# Patient Record
Sex: Female | Born: 1975 | Race: White | Hispanic: No | Marital: Single | Smoking: Current every day smoker
Health system: Southern US, Community
[De-identification: ages and names within clinical notes are randomized; demographics above are authoritative.]

## PROBLEM LIST (undated history)

## (undated) ENCOUNTER — Inpatient Hospital Stay (HOSPITAL_COMMUNITY): Payer: Self-pay

## (undated) DIAGNOSIS — O4400 Placenta previa specified as without hemorrhage, unspecified trimester: Secondary | ICD-10-CM

## (undated) DIAGNOSIS — R87629 Unspecified abnormal cytological findings in specimens from vagina: Secondary | ICD-10-CM

## (undated) DIAGNOSIS — C539 Malignant neoplasm of cervix uteri, unspecified: Secondary | ICD-10-CM

## (undated) DIAGNOSIS — Z789 Other specified health status: Secondary | ICD-10-CM

## (undated) DIAGNOSIS — Z86718 Personal history of other venous thrombosis and embolism: Secondary | ICD-10-CM

## (undated) HISTORY — PX: TONSILLECTOMY: SUR1361

## (undated) HISTORY — DX: Unspecified abnormal cytological findings in specimens from vagina: R87.629

## (undated) HISTORY — PX: APPENDECTOMY: SHX54

## (undated) HISTORY — PX: CERVICAL CONE BIOPSY: SUR198

## (undated) HISTORY — PX: ABDOMINAL SURGERY: SHX537

## (undated) HISTORY — DX: Malignant neoplasm of cervix uteri, unspecified: C53.9

## (undated) HISTORY — PX: CHOLECYSTECTOMY: SHX55

---

## 2000-02-24 ENCOUNTER — Emergency Department (HOSPITAL_COMMUNITY): Admission: EM | Admit: 2000-02-24 | Discharge: 2000-02-24 | Payer: Self-pay | Admitting: Emergency Medicine

## 2000-02-24 ENCOUNTER — Encounter: Payer: Self-pay | Admitting: Emergency Medicine

## 2000-12-10 ENCOUNTER — Emergency Department (HOSPITAL_COMMUNITY): Admission: EM | Admit: 2000-12-10 | Discharge: 2000-12-10 | Payer: Self-pay | Admitting: *Deleted

## 2000-12-12 ENCOUNTER — Emergency Department (HOSPITAL_COMMUNITY): Admission: EM | Admit: 2000-12-12 | Discharge: 2000-12-12 | Payer: Self-pay | Admitting: Emergency Medicine

## 2000-12-12 ENCOUNTER — Encounter: Payer: Self-pay | Admitting: Emergency Medicine

## 2000-12-12 ENCOUNTER — Encounter: Payer: Self-pay | Admitting: General Surgery

## 2000-12-12 ENCOUNTER — Inpatient Hospital Stay (HOSPITAL_COMMUNITY): Admission: AD | Admit: 2000-12-12 | Discharge: 2000-12-19 | Payer: Self-pay | Admitting: General Surgery

## 2000-12-13 ENCOUNTER — Encounter: Payer: Self-pay | Admitting: General Surgery

## 2000-12-14 ENCOUNTER — Encounter: Payer: Self-pay | Admitting: General Surgery

## 2001-01-05 ENCOUNTER — Emergency Department (HOSPITAL_COMMUNITY): Admission: EM | Admit: 2001-01-05 | Discharge: 2001-01-05 | Payer: Self-pay | Admitting: *Deleted

## 2001-01-05 ENCOUNTER — Encounter: Payer: Self-pay | Admitting: *Deleted

## 2002-04-06 ENCOUNTER — Ambulatory Visit (HOSPITAL_COMMUNITY): Admission: RE | Admit: 2002-04-06 | Discharge: 2002-04-06 | Payer: Self-pay | Admitting: *Deleted

## 2002-04-06 ENCOUNTER — Encounter: Payer: Self-pay | Admitting: *Deleted

## 2002-04-24 ENCOUNTER — Inpatient Hospital Stay (HOSPITAL_COMMUNITY): Admission: AD | Admit: 2002-04-24 | Discharge: 2002-04-27 | Payer: Self-pay | Admitting: *Deleted

## 2002-04-28 ENCOUNTER — Emergency Department (HOSPITAL_COMMUNITY): Admission: EM | Admit: 2002-04-28 | Discharge: 2002-04-28 | Payer: Self-pay | Admitting: Emergency Medicine

## 2002-08-18 ENCOUNTER — Emergency Department (HOSPITAL_COMMUNITY): Admission: EM | Admit: 2002-08-18 | Discharge: 2002-08-18 | Payer: Self-pay | Admitting: *Deleted

## 2002-08-18 ENCOUNTER — Encounter: Payer: Self-pay | Admitting: *Deleted

## 2003-03-29 ENCOUNTER — Ambulatory Visit (HOSPITAL_COMMUNITY): Admission: EM | Admit: 2003-03-29 | Discharge: 2003-03-29 | Payer: Self-pay | Admitting: Emergency Medicine

## 2003-03-29 ENCOUNTER — Encounter: Payer: Self-pay | Admitting: *Deleted

## 2003-04-10 ENCOUNTER — Ambulatory Visit (HOSPITAL_COMMUNITY): Admission: AD | Admit: 2003-04-10 | Discharge: 2003-04-10 | Payer: Self-pay | Admitting: Obstetrics and Gynecology

## 2003-05-17 ENCOUNTER — Inpatient Hospital Stay (HOSPITAL_COMMUNITY): Admission: EM | Admit: 2003-05-17 | Discharge: 2003-05-20 | Payer: Self-pay | Admitting: *Deleted

## 2003-06-04 ENCOUNTER — Ambulatory Visit (HOSPITAL_COMMUNITY): Admission: RE | Admit: 2003-06-04 | Discharge: 2003-06-04 | Payer: Self-pay | Admitting: *Deleted

## 2003-06-24 ENCOUNTER — Ambulatory Visit (HOSPITAL_COMMUNITY): Admission: AD | Admit: 2003-06-24 | Discharge: 2003-06-24 | Payer: Self-pay | Admitting: *Deleted

## 2003-07-09 ENCOUNTER — Inpatient Hospital Stay (HOSPITAL_COMMUNITY): Admission: AD | Admit: 2003-07-09 | Discharge: 2003-07-11 | Payer: Self-pay | Admitting: *Deleted

## 2003-11-02 ENCOUNTER — Emergency Department (HOSPITAL_COMMUNITY): Admission: EM | Admit: 2003-11-02 | Discharge: 2003-11-03 | Payer: Self-pay | Admitting: Emergency Medicine

## 2003-11-05 ENCOUNTER — Emergency Department (HOSPITAL_COMMUNITY): Admission: EM | Admit: 2003-11-05 | Discharge: 2003-11-05 | Payer: Self-pay | Admitting: Emergency Medicine

## 2003-12-12 ENCOUNTER — Emergency Department (HOSPITAL_COMMUNITY): Admission: EM | Admit: 2003-12-12 | Discharge: 2003-12-12 | Payer: Self-pay | Admitting: Emergency Medicine

## 2004-03-15 ENCOUNTER — Emergency Department (HOSPITAL_COMMUNITY): Admission: EM | Admit: 2004-03-15 | Discharge: 2004-03-15 | Payer: Self-pay | Admitting: Emergency Medicine

## 2004-03-17 ENCOUNTER — Emergency Department (HOSPITAL_COMMUNITY): Admission: EM | Admit: 2004-03-17 | Discharge: 2004-03-17 | Payer: Self-pay | Admitting: *Deleted

## 2004-04-15 ENCOUNTER — Emergency Department (HOSPITAL_COMMUNITY): Admission: EM | Admit: 2004-04-15 | Discharge: 2004-04-15 | Payer: Self-pay | Admitting: Emergency Medicine

## 2004-04-19 ENCOUNTER — Emergency Department (HOSPITAL_COMMUNITY): Admission: EM | Admit: 2004-04-19 | Discharge: 2004-04-19 | Payer: Self-pay | Admitting: Emergency Medicine

## 2004-08-06 ENCOUNTER — Other Ambulatory Visit: Admission: RE | Admit: 2004-08-06 | Discharge: 2004-08-06 | Payer: Self-pay | Admitting: *Deleted

## 2004-10-09 ENCOUNTER — Emergency Department (HOSPITAL_COMMUNITY): Admission: EM | Admit: 2004-10-09 | Discharge: 2004-10-09 | Payer: Self-pay | Admitting: Emergency Medicine

## 2005-10-18 ENCOUNTER — Emergency Department (HOSPITAL_COMMUNITY): Admission: EM | Admit: 2005-10-18 | Discharge: 2005-10-18 | Payer: Self-pay | Admitting: Emergency Medicine

## 2005-12-03 ENCOUNTER — Emergency Department (HOSPITAL_COMMUNITY): Admission: EM | Admit: 2005-12-03 | Discharge: 2005-12-03 | Payer: Self-pay | Admitting: Emergency Medicine

## 2007-01-28 ENCOUNTER — Emergency Department (HOSPITAL_COMMUNITY): Admission: EM | Admit: 2007-01-28 | Discharge: 2007-01-28 | Payer: Self-pay | Admitting: Emergency Medicine

## 2007-04-26 ENCOUNTER — Emergency Department (HOSPITAL_COMMUNITY): Admission: EM | Admit: 2007-04-26 | Discharge: 2007-04-26 | Payer: Self-pay | Admitting: *Deleted

## 2008-02-26 ENCOUNTER — Emergency Department (HOSPITAL_COMMUNITY): Admission: EM | Admit: 2008-02-26 | Discharge: 2008-02-26 | Payer: Self-pay | Admitting: Emergency Medicine

## 2008-02-27 ENCOUNTER — Ambulatory Visit (HOSPITAL_COMMUNITY): Admission: RE | Admit: 2008-02-27 | Discharge: 2008-02-27 | Payer: Self-pay | Admitting: Emergency Medicine

## 2008-04-07 ENCOUNTER — Encounter: Payer: Self-pay | Admitting: Emergency Medicine

## 2008-04-08 ENCOUNTER — Inpatient Hospital Stay (HOSPITAL_COMMUNITY): Admission: AD | Admit: 2008-04-08 | Discharge: 2008-04-08 | Payer: Self-pay | Admitting: Obstetrics & Gynecology

## 2009-03-29 ENCOUNTER — Observation Stay (HOSPITAL_COMMUNITY): Admission: AD | Admit: 2009-03-29 | Discharge: 2009-03-30 | Payer: Self-pay | Admitting: Family Medicine

## 2009-03-29 ENCOUNTER — Encounter: Payer: Self-pay | Admitting: Emergency Medicine

## 2009-03-29 ENCOUNTER — Ambulatory Visit: Payer: Self-pay | Admitting: Family Medicine

## 2009-08-14 ENCOUNTER — Emergency Department (HOSPITAL_COMMUNITY): Admission: EM | Admit: 2009-08-14 | Discharge: 2009-08-14 | Payer: Self-pay | Admitting: Emergency Medicine

## 2010-09-09 LAB — URINALYSIS, ROUTINE W REFLEX MICROSCOPIC
Bilirubin Urine: NEGATIVE
Glucose, UA: NEGATIVE mg/dL
Ketones, ur: NEGATIVE mg/dL
Nitrite: POSITIVE — AB
Protein, ur: 100 mg/dL — AB
Specific Gravity, Urine: 1.025 (ref 1.005–1.030)
Urobilinogen, UA: 0.2 mg/dL (ref 0.0–1.0)
pH: 6 (ref 5.0–8.0)

## 2010-09-09 LAB — COMPREHENSIVE METABOLIC PANEL
ALT: <7 U/L (ref 0–35)
AST: 12 U/L (ref 0–37)
Albumin: 3.6 g/dL (ref 3.5–5.2)
Alkaline Phosphatase: 49 U/L (ref 39–117)
BUN: 8 mg/dL (ref 6–23)
CO2: 24 mEq/L (ref 19–32)
Calcium: 8.6 mg/dL (ref 8.4–10.5)
Chloride: 106 mEq/L (ref 96–112)
Creatinine, Ser: 0.95 mg/dL (ref 0.4–1.2)
GFR calc Af Amer: >60 mL/min (ref >60)
GFR calc non Af Amer: >60 mL/min (ref >60)
Glucose, Bld: 127 mg/dL — ABNORMAL HIGH (ref 70–99)
Potassium: 3.7 mEq/L (ref 3.5–5.1)
Sodium: 139 mEq/L (ref 135–145)
Total Bilirubin: 0.6 mg/dL (ref 0.3–1.2)
Total Protein: 6.3 g/dL (ref 6.0–8.3)

## 2010-09-09 LAB — URINE CULTURE: Colony Count: >=100000

## 2010-09-09 LAB — RAPID URINE DRUG SCREEN, HOSP PERFORMED
Amphetamines: NOT DETECTED
Barbiturates: NOT DETECTED
Benzodiazepines: NOT DETECTED
Cocaine: POSITIVE — AB
Opiates: NOT DETECTED
Tetrahydrocannabinol: POSITIVE — AB

## 2010-09-09 LAB — CBC
HCT: 39.8 % (ref 36.0–46.0)
Hemoglobin: 13.6 g/dL (ref 12.0–15.0)
MCHC: 34.1 g/dL (ref 30.0–36.0)
MCV: 88.1 fL (ref 78.0–100.0)
Platelets: 132 10*3/uL — ABNORMAL LOW (ref 150–400)
RBC: 4.52 MIL/uL (ref 3.87–5.11)
RDW: 13.1 % (ref 11.5–15.5)
WBC: 9.1 10*3/uL (ref 4.0–10.5)

## 2010-09-09 LAB — LIPASE, BLOOD: Lipase: 25 U/L (ref 11–59)

## 2010-09-09 LAB — DIFFERENTIAL
Basophils Absolute: 0 10*3/uL (ref 0.0–0.1)
Basophils Relative: 0 % (ref 0–1)
Eosinophils Absolute: 0.1 10*3/uL (ref 0.0–0.7)
Eosinophils Relative: 1 % (ref 0–5)
Lymphocytes Relative: 10 % — ABNORMAL LOW (ref 12–46)
Lymphs Abs: 0.9 10*3/uL (ref 0.7–4.0)
Monocytes Absolute: 0.4 10*3/uL (ref 0.1–1.0)
Monocytes Relative: 4 % (ref 3–12)
Neutro Abs: 7.7 10*3/uL (ref 1.7–7.7)
Neutrophils Relative %: 84 % — ABNORMAL HIGH (ref 43–77)

## 2010-09-09 LAB — URINE MICROSCOPIC-ADD ON

## 2010-09-09 LAB — PREGNANCY, URINE: Preg Test, Ur: NEGATIVE

## 2010-09-24 LAB — CBC
HCT: 34.7 % — ABNORMAL LOW (ref 36.0–46.0)
HCT: 32.6 % — ABNORMAL LOW (ref 36.0–46.0)
Hemoglobin: 12.4 g/dL (ref 12.0–15.0)
Hemoglobin: 11.5 g/dL — ABNORMAL LOW (ref 12.0–15.0)
MCHC: 35.8 g/dL (ref 30.0–36.0)
MCHC: 35.1 g/dL (ref 30.0–36.0)
MCHC: 35.3 g/dL (ref 30.0–36.0)
MCV: 87.1 fL (ref 78.0–100.0)
MCV: 88.6 fL (ref 78.0–100.0)
MCV: 88.7 fL (ref 78.0–100.0)
Platelets: 156 10*3/uL (ref 150–400)
Platelets: 141 10*3/uL — ABNORMAL LOW (ref 150–400)
RBC: 3.98 MIL/uL (ref 3.87–5.11)
RBC: 3.68 MIL/uL — ABNORMAL LOW (ref 3.87–5.11)
RBC: 3.69 MIL/uL — ABNORMAL LOW (ref 3.87–5.11)
RDW: 12.8 % (ref 11.5–15.5)
RDW: 13.1 % (ref 11.5–15.5)
WBC: 6.6 10*3/uL (ref 4.0–10.5)

## 2010-09-24 LAB — DIFFERENTIAL
Basophils Absolute: 0 10*3/uL (ref 0.0–0.1)
Basophils Relative: 0 % (ref 0–1)
Basophils Relative: 1 % (ref 0–1)
Eosinophils Absolute: 0.1 10*3/uL (ref 0.0–0.7)
Eosinophils Relative: 3 % (ref 0–5)
Eosinophils Relative: 3 % (ref 0–5)
Monocytes Absolute: 0.3 10*3/uL (ref 0.1–1.0)
Monocytes Relative: 5 % (ref 3–12)
Neutro Abs: 4.9 10*3/uL (ref 1.7–7.7)
Neutrophils Relative %: 71 % (ref 43–77)

## 2010-09-24 LAB — BASIC METABOLIC PANEL
BUN: 9 mg/dL (ref 6–23)
CO2: 26 mEq/L (ref 19–32)
Calcium: 8.7 mg/dL (ref 8.4–10.5)
Chloride: 107 mEq/L (ref 96–112)
Creatinine, Ser: 0.48 mg/dL (ref 0.4–1.2)
GFR calc Af Amer: >60 mL/min (ref >60)
GFR calc non Af Amer: >60 mL/min (ref >60)
Glucose, Bld: 77 mg/dL (ref 70–99)
Potassium: 3.5 mEq/L (ref 3.5–5.1)
Sodium: 137 mEq/L (ref 135–145)

## 2010-09-24 LAB — URINALYSIS, ROUTINE W REFLEX MICROSCOPIC
Bilirubin Urine: NEGATIVE
Glucose, UA: NEGATIVE mg/dL
Hgb urine dipstick: NEGATIVE
Ketones, ur: NEGATIVE mg/dL
Nitrite: NEGATIVE
Protein, ur: NEGATIVE mg/dL
Specific Gravity, Urine: 1.025 (ref 1.005–1.030)
Urobilinogen, UA: 0.2 mg/dL (ref 0.0–1.0)
pH: 6.5 (ref 5.0–8.0)

## 2010-09-24 LAB — RAPID URINE DRUG SCREEN, HOSP PERFORMED
Amphetamines: NOT DETECTED
Barbiturates: NOT DETECTED
Benzodiazepines: NOT DETECTED
Cocaine: POSITIVE — AB
Opiates: POSITIVE — AB
Tetrahydrocannabinol: NOT DETECTED

## 2010-09-24 LAB — RPR: RPR Ser Ql: NONREACTIVE

## 2010-09-24 LAB — HCG, QUANTITATIVE, PREGNANCY
hCG, Beta Chain, Quant, S: 0 m[IU]/mL (ref <5)
hCG, Beta Chain, Quant, S: 63926 m[IU]/mL — ABNORMAL HIGH (ref <5)

## 2010-09-24 LAB — ETHANOL: Alcohol, Ethyl (B): <5 mg/dL (ref 0–10)

## 2010-09-24 LAB — WET PREP, GENITAL: Trich, Wet Prep: NONE SEEN

## 2010-09-24 LAB — PREGNANCY, URINE: Preg Test, Ur: POSITIVE

## 2010-11-06 NOTE — Discharge Summary (Signed)
   NAME:  Hannah Gilmore, Hannah Gilmore                       ACCOUNT NO.:  [id number]   MEDICAL RECORD NO.:  [id number]                   PATIENT TYPE:  INP   LOCATION:  A417                                 FACILITY:  [facility]   PHYSICIAN:  Langley Gauss, M.D.                DATE OF BIRTH:  Jan 16, 1976   DATE OF ADMISSION:  04/24/2002  DATE OF DISCHARGE:  04/27/2002                                 DISCHARGE SUMMARY   DIAGNOSES:  1. A 35-6/7 weeks intrauterine pregnancy.  2. Preterm labor.  3. Substance abuse preceding and during the pregnancy.  4. Insufficient prenatal care.   PROCEDURE:  Delivery performed is precipitous spontaneous assisted vaginal  delivery, a 5-pound 5-ounce female infant delivered over a midline episiotomy  with repair.   LABORATORY DATA:  Pertinent laboratory studies:  Admission hemoglobin and  hematocrit 13.0/36.7 with a white count of 10.9.  Postpartum day #1,  12.4/35.8 with a white count of 7.7.  Pertinently, urine drug screen  obtained on hospitalization day #1 was positive for both for cocaine and for  opiates.  Testing on the newborn infant is currently pending.   DISPOSITION:  The patient will follow up in the office in four weeks' time  for postpartum care.  She is bottle feeding at time of discharge.  She is  complaining of some breast engorgement.  She is advised to use cabbage  leaves for this only.  The patient does desire circumcision.  They did not  meet their financial obligation during this hospitalization; however, they  would like to possibly have this done as an outpatient in the office in one  week's time.   HOSPITAL COURSE:  See previous dictations.  The patient presented on  April 24, 2002 at preterm labor at 10 cm.  She progressed thereafter very  rapidly to a well-controlled atraumatic spontaneous assisted vaginal  delivery.  Initially, the infant did have some respiratory compromise, was  evaluated by Dr. Vivia Ewing.  Thereafter, the infant was  on an Oxyhood.  Apparently, he did well during the remainder of hospitalization.  Likewise,  the patient did well during the remainder of the hospitalization with no  excessive vaginal bleeding.  She did resume consumption of cigarettes and  made frequent trips to outside.  The patient and infant are discharged today  on day #2-1/2.  Pertinently, as indicated previously, circumcision not  performed during this hospitalization.                                               Langley Gauss, M.D.    DC/MEDQ  D:  04/27/2002  T:  04/28/2002  Job:  [id number]

## 2010-11-06 NOTE — Op Note (Signed)
NAME:  Hannah Gilmore, Hannah Gilmore                       ACCOUNT NO.:  [id number]   MEDICAL RECORD NO.:  [id number]                  PATIENT TYPE:  PINP   LOCATION:  404                                  FACILITY:  [facility]   PHYSICIAN:  Langley Gauss, M.D.                DATE OF BIRTH:  10/30/1951   DATE OF PROCEDURE:  07/09/2003  DATE OF DISCHARGE:                                 OPERATIVE REPORT   PROCEDURE:  Placement of continuous lumbar epidural analgesia at the L3-4  interspace performed by Langley Gauss, M.D.   SUMMARY:  Appropriate informed consent was obtained.  The patient placed in  the seated position, bony landmarks identified.  The patient's back is  sterilely prepped and draped in the usual sterile manner. The epidural tray  is utilized, 5 mL of 1% lidocaine placed at the midline of the L3-4  interspace to raise a small skin wheal. The 17 gauge Tuohy-Schliff needle is  then utilized to identify the epidural space.  On the first attempt, the  bony spinous process was encountered thus the epidural needle is directed  slightly caudad and on the second attempt there is excellent loss of  resistance consistent with entry into the epidural space. 5 mL of 1.5%  lidocaine plus epinephrine injected through the epidural catheter and all  signs of CSF or intravascular injection obtained. The epidural catheter  tubing is then inserted to a depth of 5 cm.  The epidural needle is removed,  aspiration test is negative.  A second test dose 2 mL, 1.5% lidocaine plus  epinephrine injected through the epidural catheter.  Again no signs of CSF  or intravascular injection obtained thus the catheter is secured into place.  The patient is returned to the left lateral decubitus position and connected  to the infusion pump with the standard mixture. She will be treated with a  bolus of 10 mL followed by a continuous infusion rate of 14 mL per hour.  At  the completion of the procedure, the patient is  examined, she is noted to be  5 cm dilated, 60% effaced with a vertex of 0 station.  Fetal scalp electrode  was placed with the resulting amniotomy and a reassuring fetal heart rate is  noted.      ___________________________________________                                            Langley Gauss, M.D.   DC/MEDQ  D:  07/09/2003  T:  07/09/2003  Job:  [id number]
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
## 2010-11-06 NOTE — Op Note (Signed)
NAME:  Hannah Gilmore, Hannah Gilmore                       ACCOUNT NO.:  [id number]   MEDICAL RECORD NO.:  [id number]                   PATIENT TYPE:  INP   LOCATION:  A417                                 FACILITY:  [facility]   PHYSICIAN:  Langley Gauss, M.D.                DATE OF BIRTH:  09-Sep-1975   DATE OF PROCEDURE:  04/25/2002  DATE OF DISCHARGE:                                 OPERATIVE REPORT   DIAGNOSIS:  35 6/7 week intrauterine pregnancy presenting in preterm labor.   DELIVERY PERFORMED:  Precipitous well controlled spontaneous cystovaginal  delivery of 5 pound 5 ounce female infant delivered over a midline episiotomy  with repair.   ANALGESIA FOR DELIVERY:  The patient received only lidocaine local at time  of delivery.   Apgar score 7&9, A positive blood type for the patient.   HOSPITAL COURSE:  The patient presented p.m. of April 24, 2002 at complete  dilatation; thereafter, she progressed very rapidly to a well controlled  vaginal delivery which was performed on April 24, 2002. The patient  presented at complete dilatation. Fetal scalp electrode was placed with a  resulting amniotomy, clear amniotic fluid was noted, reassuring fetal heart  rate was noted. The patient thereafter had a strong urge to push. She was  placed in the dorsal lithotomy position, prepped and draped in the usual  sterile fashion. She pushed well during a short second stage of labor. A  small midline episiotomy was performed. Infant delivered in a direct OA  position over the midline episiotomy without extension. Mouth and nares were  both suctioned of clear amniotic fluid. Renewed expulsive efforts result in  spontaneous rotation to a left anterior shoulder position. Gentle abdominal  retraction ______________ expulsive efforts resulted in delivery of the  shoulder through the symphysis without difficulty, the remainder of the  infant likewise delivered without difficulty. The umbilical cord was then  milked towards the infant, cord was doubly clamped and cut and infant was  handed to the nursing staff for immediate assessment. Arterial cord gas and  cord blood are then obtained. Gentle traction on the umbilical cord resulted  in separation which upon examination appears to be an intact three vessel  placenta with attached cord. Excellent uterine tone is achieved immediately  following delivery utilizing IV Pitocin solution. Episiotomy is easily  repaired utilizing a single layer closure of #0 Chromic on the perineal  body. The patient tolerated the delivery very well. The infant, due to  prematurity, is having mild respiratory distress thus Dr. Vivia Ewing is  consulted for immediate newborn care.                                               Langley Gauss, M.D.    DC/MEDQ  D:  04/26/2002  T:  04/27/2002  Job:  [id number]

## 2010-11-06 NOTE — H&P (Signed)
   NAME:  Hannah Gilmore, Hannah Gilmore                       ACCOUNT NO.:  [id number]   MEDICAL RECORD NO.:  [id number]                   PATIENT TYPE:  INP   LOCATION:  A417                                 FACILITY:  [facility]   PHYSICIAN:  Langley Gauss, M.D.                DATE OF BIRTH:  02/11/1976   DATE OF ADMISSION:  04/24/2002  DATE OF DISCHARGE:                                HISTORY & PHYSICAL   HISTORY OF PRESENT ILLNESS:  This is a 35 year old gravida III, para II at  35-6/[redacted] weeks gestation, presents to [facility] in active labor,  complaining of onset of uterine contractions during that day. Pertinently,  the patient had had a scheduled prenatal visit during today's date which she  failed to keep. The patient's prenatal course is complicated by findings of  positive cocaine screen. She has only had two prenatal visits. The patient  does admit to cocaine use this a.m. The patient was advised of this by  social services and at one point and time was off it but decline to proceed  with drug rehab program.   PAST OBSTETRICAL HISTORY:  The patient's past OB history:  10/93, vaginal  delivery, 6 pound infant at [redacted] weeks gestation; 3/00 vaginal delivery, 4  pounds at [redacted] weeks gestation which was in the intensive care unit for seven  days duration.   ALLERGIES:  No known drug allergies.   CURRENT MEDICATIONS:  Prenatal vitamins only.   PHYSICAL EXAMINATION:  VITAL SIGNS:  Height 5 feet 4, 110 pounds. 140/79,  pulse 117, respiratory rate 24.  HEENT:  Negative.  NECK:  No adenopathy. Neck is supple. Thyroid is nonpalpable.  LUNGS:  Clear.  CARDIOVASCULAR:  Regular, rate, and rhythm.  ABDOMEN:  Soft and nontender. Gravid uterus identified. Fundal head at 36  cm. She has a vertex presentation by [employer]. There is no uterine  tenderness. Intact membranes identified.  PELVIC EXAM:  Completely dilated, +1 station, completely effaced with intact  bulging membranes. External  fetal monitor contractions every three minutes  with reassuring fetal heart rate.   ASSESSMENT:  A 35-6/7 weeks intrauterine pregnancy, presenting in active  preterm labor with delivery noted to be eminent. Preparations made hurriedly  for expected vaginal delivery.                                               Langley Gauss, M.D.    DC/MEDQ  D:  04/26/2002  T:  04/27/2002  Job:  [id number]
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
## 2010-11-06 NOTE — Discharge Summary (Signed)
NAME:  Hannah Gilmore, Hannah Gilmore                       ACCOUNT NO.:  [id number]   MEDICAL RECORD NO.:  [id number]                   PATIENT TYPE:  INP   LOCATION:  LDR4                                 FACILITY:  [facility]   PHYSICIAN:  Langley Gauss, M.D.                DATE OF BIRTH:  [birth year]   DATE OF ADMISSION:  07/09/2003  DATE OF DISCHARGE:  07/11/2003                                 DISCHARGE SUMMARY   No dictation for this job.     ___________________________________________                                         Langley Gauss, M.D.   DC/MEDQ  D:  07/13/2003  T:  07/13/2003  Job:  [id number]

## 2010-11-06 NOTE — Discharge Summary (Signed)
NAME:  Hannah Gilmore, Hannah Gilmore                       ACCOUNT NO.:  [id number]   MEDICAL RECORD NO.:  [id number]                   PATIENT TYPE:  INP   LOCATION:  LDR4                                 FACILITY:  [facility]   PHYSICIAN:  Langley Gauss, M.D.                DATE OF BIRTH:  1975-10-14   DATE OF ADMISSION:  07/09/2003  DATE OF DISCHARGE:  07/11/2003                                 DISCHARGE SUMMARY   Delivery was performed on July 09, 2003 without difficulty, utilizing an  epidural analgesic which was placed during the course of labor by Dr. Langley Gauss.   DISPOSITION:  At time of discharge, the patient is advised to follow up in  the office in four weeks' time. She does desire permanent sterilization  which can be scheduled as an outpatient procedure at that point in time.   PERTINENT LABORATORY DATA:  Hemoglobin and hematocrit 12.8/37.5 with a white  count of 13.3, on postpartum day #1, 12.4/35.2 with a white count of 15.2.  The patient was noted to have tested positive for THC and positive for  opiates on drug screen obtained upon presentation to labor and delivery.  She, however, was noted to be negative for cocaine and its metabolites on  that drug screen. A positive blood type.   DISCHARGE MEDICATIONS:  1. Tylox #30 only for postpartum pain relief.  2. Ambien #30 to take one 10-mg tablet p.o. q.h.s. p.r.n.   HOSPITAL COURSE:  The patient delivered on July 09, 2003 in an  uncomplicated manner utilizing an epidural analgesic. She was noted to be  preterm at 35-1/[redacted] weeks gestation upon presentation and labor. Though  patient had been noted to test negative for group B strep carrier status,  this was performed early; thus, according to the high-risk protocol, the  patient was treated during the course of labor with IV ampicillin.  Postpartum, both mother and infant did well. There were no postpartum  complications. Mother bonded well with the infant and had good  family  support such that patient was prepared for discharge and was given a copy of  the standardized discharge instructions to be discharged on July 11, 2003.     ___________________________________________                                         Langley Gauss, M.D.   DC/MEDQ  D:  07/15/2003  T:  07/15/2003  Job:  [id number]
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
## 2010-11-06 NOTE — H&P (Signed)
NAME:  Hannah Gilmore, COFFIN NO.:  [id number]   MEDICAL RECORD NO.:  [id number]                   PATIENT TYPE:   LOCATION:                                       FACILITY:   PHYSICIAN:  Langley Gauss, MD                  DATE OF BIRTH:  October 09, 1975   DATE OF ADMISSION:  DATE OF DISCHARGE:                                HISTORY & PHYSICAL   HISTORY OF PRESENT ILLNESS:  The patient is a 35 year old gravida 3, para 2  with an unknown date of admission who was seen in the office as a new OB  patient today.  Currently, the patient has had inadequate prenatal care.  She had had one previous visit to [facility], at which time she was  treated for preterm labor with terbutaline 5 mg p.o. q.4h.  The patient did  not have an ultrasound done.  Her best guesstimate is that her gestational  age right now is [redacted] weeks gestation; however, her last menstrual period is  unreliable, as she states it occurred sometime in February and, in addition  to that, the last one was abnormal and that it was only several days of  spotting.  Thus, no reliable dates are available at present.  The patient's  prenatal course complicated by this single visit to the hospital only.  The  patient has continued on modified bedrest and done well with no complaints  of uterine contractions.  No complaints of pelvic pressure.  She does state  that she seems to have onset of uterine contractions about the time it is  ready for p.o. terbutaline.  She did take her last p.o. terbutaline last  p.m.  The patient came to our office today, April 03, 2002, as a new  patient.   PAST OBSTETRICAL HISTORY:  October 1993 vaginal delivery, 6-pound infant, [redacted]  weeks gestation.  August 28, 1998 vaginal delivery at [facility] of a 4-  pound infant at [redacted] weeks gestation.  The patient states she was cared for by  Dr. Gilford Silvius in [place] during that pregnancy.  She presented complaining of  pelvic pressure,  was seen in our office, at which time she was noted to be 2-  cm dilated, referred to [facility], at which time she was noted to have  regular uterine contractions.  Uterine contractions continued until she was  4-cm dilatation.  She was thereafter transferred to [facility] where  the labor continued through delivery 13 hours later.  The infant was in the  ICU at [facility] x7 days' duration.   ALLERGIES:  No known drug allergies.   CURRENT MEDICATIONS:  1. Prenatal vitamins.  2. Last p.o. terbutaline last p.m.   PAST MEDICAL HISTORY:  Tonsils in 1995.  The patient was treated with laser  of the cervix in 1997 for what she describes as cervical cancer.  In 2002,  had appendix and gallbladder removed.  Two prior vaginal deliveries.   SOCIAL HISTORY:  The patient is, of course, unemployed.  She is separated  and does smoke, of course, one pack per day.   PHYSICAL EXAMINATION:  GENERAL:  Appears greater than stated age.  VITAL SIGNS:  Blood pressure 114/73, pulse rate of 80, respiratory rate is  20.  HEENT:  Negative.  No adenopathy.  NECK:  Supple.  Thyroid is nonpalpable.  Mild acne.  LUNGS:  Clear.  CARDIOVASCULAR:  Regular rate and rhythm.  ABDOMEN:  Soft and nontender.  Laparoscopic surgical incisions only.  Vertex  presentation by Leopold's maneuvers.  Fundal height of 31 cm.  EXTREMITIES:  Normal.  PELVIC:  Normal external genitalia.  No lesions or ulcerations identified.  No leakage of fluid or vaginal bleeding.  No abnormal discharge identified.  Cervix noted to be 1 cm dilated, -2 station, 60% effaced, with a vertex  presentation palpable.   LABORATORY DATA:  A limited sonogram was performed by Dr. Roylene Reason. Lisette Grinder  in the office which reveals a single intrauterine pregnancy, vertex  presentation, female fetus, normal amniotic fluid subjectively.  Good fetal  movement was identified.  Normal fetal tone.  Fetal cardiac activity is  likewise identified with a  four-chamber view seen.  Noted the posterior  fundal placenta.  Parameters are obtained for femur length, BPD, and  abdominal circumference which are all consistent with [redacted] weeks gestation.   ASSESSMENT:  Inadequate prenatal care.  Late transfer to our office.  Best  gestational age at the present would be about [redacted] weeks gestation.  The  patient with some premature dilatation and history of prior preterm  delivery.  Thus, at this point in time, laboratory studies will be repeated,  prenatal profile.  She is referred to [facility] for complete ultrasound to  include anatomic survey and the patient has restricted activities to  bedrest.  She will follow up in the office in one week's time, at which time  we can review her findings to date.  She is continued on the terbutaline 2.5  mg p.o. q.4h. while awake #40 with no refill.  She is planning on utilizing  pediatrician on-call.  Signs and symptoms of preterm labor and spontaneous  rupture of membranes are reviewed today.  The patient is well advised and  aware to present to [facility] should onset of uterine contractions recur.                                               Langley Gauss, MD    DC/MEDQ  D:  04/03/2002  T:  04/03/2002  Job:  [id number]

## 2010-11-06 NOTE — Discharge Summary (Signed)
NAME:  Hannah Gilmore, Hannah Gilmore                       ACCOUNT NO.:  [id number]   MEDICAL RECORD NO.:  [id number]                   PATIENT TYPE:  OBV   LOCATION:  A415                                 FACILITY:  [facility]   PHYSICIAN:  Langley Gauss, M.D.                DATE OF BIRTH:  1975/08/06   DATE OF ADMISSION:  04/10/2003  DATE OF DISCHARGE:  04/10/2003                                 DISCHARGE SUMMARY   OB OBSERVATION NOTE.   DATE OF VISIT:  April 10, 2003   This is a 35 year old gravida 4, para 3, currently at [redacted] weeks gestation who  presents to labor and delivery with the chief complaint of leaking fluid.  The patient states that most of the day she had a sticky mucus-like  discharge and then about 1800 this p.m. she leaked some clear fluid which  she stated had the consistency of urine.  She has had no recurrence of that.  She denies any vaginal bleeding, she denies any uterine cramping. The  patient's prenatal course is complicated by late presentation.  Her first  prenatal visit was to labor and delivery as an OB unassigned. I took care of  her during that visit. She was noted to test positive, at that time, for  cocaine and THC and has been counseled regarding that.   PAST MEDICAL HISTORY:  She has had 3 prior vaginal deliveries, the most  recent delivering at 37 weeks.  The patient did well during her pregnancy  with cessation of cocaine use, however, in between pregnancies the addictive  nature of the drug resulted in a relapse.   PHYSICAL EXAMINATION:  GENERAL:  No distress.  VITAL SIGNS:  115/62, respiratory rate is 20, pulse 100.  HEENT:  Negative.  NECK:  No adenopathy.  Neck is supple.  Thyroid is nonpalpable.  LUNGS:  Clear.  CARDIOVASCULAR:  Regular rate and rhythm.  ABDOMEN:  The abdomen is soft and nontender.  No surgical scars are  identified.  Vertex presentation Leopold's maneuvers.   On external fetal monitor no uterine contractions are identified.   Fetal  heart rate is normal for 22 weeks with a baseline of 130-140, but of course  decreased long term variability.  PELVIC:  Examination per Dr. Lisette Grinder reveals external genitalia to be within  normal limits.  Mucus-like discharge is noted to be present just at the  introitus.  Sterile speculum examination reveals the cervix to be without  lesions; however, there is significant amount of purulent looking discharge  in the posterior vaginal fornix.  This is noted to be nitrazine negative.   Pertinent also, in the patient's history, is recent finding of positive GC  and Chlamydia cultures.  Due to the patient's nonapplication for Medicaid  and her inability to afford her own health care the patient was required to  [facility] at which time she was treated with Zithromax  1  gm p.o. x1 as well as Rocephin 250 mg IM.  She states her partner has also  been treated.   IMPRESSION:  Cervicitis.  GC and Chlamydia cultures, positive; just recently  treated, resulting in significant purulent discharge.   DISPOSITION:  The patient does have an appointment in the office in 4 days  time which she is encouraged to keep.  No evidence of ruptured membranes on  today's visit.  The signs and symptoms of spontaneous rupture of membranes  and preterm labor are reviewed with the patient.   PERTINENT LABORATORY STUDIES:  Urine drug screen is negative with the  exception of THC.     ___________________________________________                                         Langley Gauss, M.D.   DC/MEDQ  D:  04/10/2003  T:  04/10/2003  Job:  [id number]

## 2010-11-06 NOTE — Discharge Summary (Signed)
   NAME:  Hannah Gilmore, Hannah Gilmore                       ACCOUNT NO.:  [id number]   MEDICAL RECORD NO.:  [id number]                   PATIENT TYPE:  OIB   LOCATION:  A415                                 FACILITY:  [facility]   PHYSICIAN:  Langley Gauss, M.D.                DATE OF BIRTH:  April 25, 1976   DATE OF ADMISSION:  03/29/2003  DATE OF DISCHARGE:  03/29/2003                                 DISCHARGE SUMMARY   OBSTETRICAL OBSERVATION NOTE   HISTORY OF PRESENT ILLNESS:  A 35 year old gravida 4 para 3, three prior  vaginal deliveries, presents to [facility] with complaining of  pelvic pressure x1 week duration and left inguinal pain.  The patient  presented on this date as an OB unassigned.  I had delivered her previous  pregnancy but she had had no prenatal care to date during this pregnancy.  The patient's history is complicated by history of cocaine, marijuana, and  frequent opiate use which she admits to continuing to use during this  pregnancy, currently at [redacted] weeks gestation.   PAST OBSTETRICAL HISTORY:  Three prior vaginal deliveries.   PHYSICAL EXAMINATION:  GENERAL:  No acute distress.  VITAL SIGNS:  Blood pressure 115/65, respirations 20, pulse 85, temperature  97.6.  ABDOMEN:  Soft and nontender.  Fundal height at the umbilicus, soft and  nontender.  Normal uterine tone appreciated.  PELVIC:  Normal external genitalia, no lesions or ulcerations identified.  No vaginal bleeding, no leakage of fluid.  Fetal heart tones were ausculted  in the 150s.   LABORATORY STUDIES:  A positive blood type, antibody screen is negative.  OB  ultrasound reveals intrauterine pregnancy, 20 and four-sevenths weeks  gestation.  Hemoglobin 12.8, hematocrit 37.1, white count of 8.0.  Urinalysis is negative.  The patient did on drug screen test positive for  cocaine and marijuana.  RPR is nonreactive.   HOSPITAL COURSE:  The patient presented and evaluated on March 29, 2003;  was also  discharged to home on March 29, 2003.  The patient did present as  OB unassigned.  She was counseled extensively regarding the adverse effects  of cocaine and marijuana on the pregnancy.  As she had done with previous  pregnancies she had stated that she would be compliant with abstention  during the pregnancy and hopefully this would continue following the  pregnancy.  Following complete evaluation with a prenatal profile as well as  OB ultrasound and determination that the patient was in stable status, the  patient was discharged to home.     ___________________________________________                                         Langley Gauss, M.D.   DC/MEDQ  D:  04/16/2003  T:  04/16/2003  Job:  [id number]

## 2011-03-22 LAB — RAPID URINE DRUG SCREEN, HOSP PERFORMED
Amphetamines: NOT DETECTED
Barbiturates: NOT DETECTED
Benzodiazepines: POSITIVE — AB
Cocaine: POSITIVE — AB

## 2011-03-22 LAB — DIFFERENTIAL
Basophils Absolute: 0.1
Eosinophils Relative: 2
Lymphocytes Relative: 24
Lymphs Abs: 1.4
Monocytes Absolute: 0.3
Neutro Abs: 3.9

## 2011-03-22 LAB — CBC
HCT: 32.5 — ABNORMAL LOW
MCHC: 35.2
MCV: 86.7
Platelets: 211
RDW: 12.9
WBC: 5.8

## 2011-03-22 LAB — URINALYSIS, ROUTINE W REFLEX MICROSCOPIC
Bilirubin Urine: NEGATIVE
Glucose, UA: NEGATIVE
Hgb urine dipstick: NEGATIVE
Ketones, ur: NEGATIVE
Protein, ur: NEGATIVE
Urobilinogen, UA: 0.2

## 2011-03-22 LAB — COMPREHENSIVE METABOLIC PANEL
AST: 12
Albumin: 2.8 — ABNORMAL LOW
BUN: 8
Calcium: 8.6
Chloride: 105
Creatinine, Ser: 0.51
GFR calc Af Amer: >60
Total Bilirubin: 0.1 — ABNORMAL LOW

## 2011-03-23 LAB — WET PREP, GENITAL
Clue Cells Wet Prep HPF POC: NONE SEEN
Trich, Wet Prep: NONE SEEN
Yeast Wet Prep HPF POC: NONE SEEN

## 2011-03-23 LAB — GC/CHLAMYDIA PROBE AMP, GENITAL
Chlamydia, DNA Probe: NEGATIVE
GC Probe Amp, Genital: NEGATIVE

## 2011-03-24 LAB — DIFFERENTIAL
Basophils Absolute: 0
Lymphocytes Relative: 15
Lymphs Abs: 1
Monocytes Absolute: 0.4
Monocytes Relative: 5
Neutro Abs: 5.4

## 2011-03-24 LAB — URINALYSIS, ROUTINE W REFLEX MICROSCOPIC
Nitrite: NEGATIVE
Specific Gravity, Urine: 1.02
Urobilinogen, UA: 0.2
pH: 7.5

## 2011-03-24 LAB — RAPID URINE DRUG SCREEN, HOSP PERFORMED
Amphetamines: NOT DETECTED
Opiates: NOT DETECTED
Tetrahydrocannabinol: NOT DETECTED

## 2011-03-24 LAB — BASIC METABOLIC PANEL
Calcium: 9.2
GFR calc Af Amer: >60
GFR calc non Af Amer: >60
Potassium: 3.6
Sodium: 132 — ABNORMAL LOW

## 2011-03-24 LAB — CBC
HCT: 34.7 — ABNORMAL LOW
Hemoglobin: 12.3
RBC: 3.97
WBC: 7

## 2011-03-24 LAB — GC/CHLAMYDIA PROBE AMP, GENITAL: GC Probe Amp, Genital: NEGATIVE

## 2011-03-24 LAB — WET PREP, GENITAL
Clue Cells Wet Prep HPF POC: NONE SEEN
Trich, Wet Prep: NONE SEEN
WBC, Wet Prep HPF POC: NONE SEEN
Yeast Wet Prep HPF POC: NONE SEEN

## 2011-04-05 LAB — URINALYSIS, ROUTINE W REFLEX MICROSCOPIC
Bilirubin Urine: NEGATIVE
Ketones, ur: NEGATIVE
Nitrite: NEGATIVE
Specific Gravity, Urine: >1.03 — ABNORMAL HIGH
Urobilinogen, UA: 0.2

## 2011-04-05 LAB — URINE MICROSCOPIC-ADD ON

## 2011-04-27 ENCOUNTER — Encounter: Payer: Self-pay | Admitting: *Deleted

## 2011-04-27 ENCOUNTER — Emergency Department (HOSPITAL_COMMUNITY): Admission: EM | Admit: 2011-04-27 | Discharge: 2011-04-27 | Discharge status: Against Medical Advice | Payer: Self-pay

## 2013-03-16 ENCOUNTER — Other Ambulatory Visit: Payer: Self-pay | Admitting: Obstetrics & Gynecology

## 2013-03-16 DIAGNOSIS — O3680X0 Pregnancy with inconclusive fetal viability, not applicable or unspecified: Secondary | ICD-10-CM

## 2013-03-21 ENCOUNTER — Other Ambulatory Visit: Payer: Self-pay

## 2013-03-28 ENCOUNTER — Other Ambulatory Visit: Payer: Self-pay

## 2013-03-28 ENCOUNTER — Encounter: Payer: Self-pay | Admitting: *Deleted

## 2013-09-29 ENCOUNTER — Emergency Department (HOSPITAL_COMMUNITY): Payer: Self-pay

## 2013-09-29 ENCOUNTER — Emergency Department (HOSPITAL_COMMUNITY)
Admission: EM | Admit: 2013-09-29 | Discharge: 2013-09-29 | Disposition: A | Discharge status: Other Acute Inpatient Hospital | Payer: Self-pay | Attending: Emergency Medicine | Admitting: Emergency Medicine

## 2013-09-29 ENCOUNTER — Encounter (HOSPITAL_COMMUNITY): Payer: Self-pay | Admitting: Emergency Medicine

## 2013-09-29 DIAGNOSIS — L03311 Cellulitis of abdominal wall: Secondary | ICD-10-CM

## 2013-09-29 DIAGNOSIS — Z880 Allergy status to penicillin: Secondary | ICD-10-CM | POA: Insufficient documentation

## 2013-09-29 DIAGNOSIS — R509 Fever, unspecified: Secondary | ICD-10-CM | POA: Insufficient documentation

## 2013-09-29 DIAGNOSIS — Z79899 Other long term (current) drug therapy: Secondary | ICD-10-CM | POA: Insufficient documentation

## 2013-09-29 DIAGNOSIS — Z9089 Acquired absence of other organs: Secondary | ICD-10-CM | POA: Insufficient documentation

## 2013-09-29 DIAGNOSIS — L03319 Cellulitis of trunk, unspecified: Principal | ICD-10-CM

## 2013-09-29 DIAGNOSIS — Z9889 Other specified postprocedural states: Secondary | ICD-10-CM | POA: Insufficient documentation

## 2013-09-29 DIAGNOSIS — F17200 Nicotine dependence, unspecified, uncomplicated: Secondary | ICD-10-CM | POA: Insufficient documentation

## 2013-09-29 DIAGNOSIS — L02219 Cutaneous abscess of trunk, unspecified: Secondary | ICD-10-CM | POA: Insufficient documentation

## 2013-09-29 LAB — CBC WITH DIFFERENTIAL/PLATELET
BASOS ABS: 0 10*3/uL (ref 0.0–0.1)
Basophils Relative: 0 % (ref 0–1)
EOS PCT: 1 % (ref 0–5)
Eosinophils Absolute: 0.1 10*3/uL (ref 0.0–0.7)
HEMATOCRIT: 33.3 % — AB (ref 36.0–46.0)
HEMOGLOBIN: 12 g/dL (ref 12.0–15.0)
LYMPHS PCT: 6 % — AB (ref 12–46)
Lymphs Abs: 0.6 10*3/uL — ABNORMAL LOW (ref 0.7–4.0)
MCH: 30.5 pg (ref 26.0–34.0)
MCHC: 36 g/dL (ref 30.0–36.0)
MCV: 84.7 fL (ref 78.0–100.0)
MONOS PCT: 6 % (ref 3–12)
Monocytes Absolute: 0.6 10*3/uL (ref 0.1–1.0)
NEUTROS ABS: 8.4 10*3/uL — AB (ref 1.7–7.7)
Neutrophils Relative %: 87 % — ABNORMAL HIGH (ref 43–77)
Platelets: 160 10*3/uL (ref 150–400)
RBC: 3.93 MIL/uL (ref 3.87–5.11)
RDW: 12.8 % (ref 11.5–15.5)
WBC: 9.7 10*3/uL (ref 4.0–10.5)

## 2013-09-29 LAB — BASIC METABOLIC PANEL
BUN: 7 mg/dL (ref 6–23)
CALCIUM: 8.8 mg/dL (ref 8.4–10.5)
CO2: 26 mEq/L (ref 19–32)
CREATININE: 0.78 mg/dL (ref 0.50–1.10)
Chloride: 99 mEq/L (ref 96–112)
GFR calc Af Amer: >90 mL/min (ref >90)
GFR calc non Af Amer: >90 mL/min (ref >90)
Glucose, Bld: 103 mg/dL — ABNORMAL HIGH (ref 70–99)
Potassium: 3.8 mEq/L (ref 3.7–5.3)
Sodium: 138 mEq/L (ref 137–147)

## 2013-09-29 LAB — URINALYSIS, ROUTINE W REFLEX MICROSCOPIC
Glucose, UA: NEGATIVE mg/dL
HGB URINE DIPSTICK: NEGATIVE
KETONES UR: 15 mg/dL — AB
Leukocytes, UA: NEGATIVE
Nitrite: NEGATIVE
PH: 6 (ref 5.0–8.0)
PROTEIN: NEGATIVE mg/dL
Specific Gravity, Urine: >1.03 — ABNORMAL HIGH (ref 1.005–1.030)
Urobilinogen, UA: 0.2 mg/dL (ref 0.0–1.0)

## 2013-09-29 MED ORDER — SODIUM CHLORIDE 0.9 % IV BOLUS (SEPSIS)
1000.0000 mL | Freq: Once | INTRAVENOUS | Status: AC
  Administered 2013-09-29: 1000 mL via INTRAVENOUS

## 2013-09-29 MED ORDER — DIPHENHYDRAMINE HCL 50 MG/ML IJ SOLN
25.0000 mg | Freq: Once | INTRAMUSCULAR | Status: AC
  Administered 2013-09-29: 25 mg via INTRAVENOUS
  Filled 2013-09-29: qty 1

## 2013-09-29 MED ORDER — FAMOTIDINE IN NACL 20-0.9 MG/50ML-% IV SOLN
20.0000 mg | Freq: Once | INTRAVENOUS | Status: AC
  Administered 2013-09-29: 20 mg via INTRAVENOUS
  Filled 2013-09-29: qty 50

## 2013-09-29 MED ORDER — DEXTROSE 5 % IV SOLN
500.0000 mg | Freq: Once | INTRAVENOUS | Status: AC
  Administered 2013-09-29: 500 mg via INTRAVENOUS

## 2013-09-29 MED ORDER — HYDROMORPHONE HCL PF 1 MG/ML IJ SOLN
1.0000 mg | Freq: Once | INTRAMUSCULAR | Status: AC
  Administered 2013-09-29: 1 mg via INTRAVENOUS
  Filled 2013-09-29: qty 1

## 2013-09-29 MED ORDER — IOHEXOL 300 MG/ML  SOLN
50.0000 mL | Freq: Once | INTRAMUSCULAR | Status: AC | PRN
  Administered 2013-09-29: 50 mL via ORAL

## 2013-09-29 MED ORDER — MORPHINE SULFATE 4 MG/ML IJ SOLN
4.0000 mg | Freq: Once | INTRAMUSCULAR | Status: AC
  Administered 2013-09-29: 4 mg via INTRAVENOUS
  Filled 2013-09-29: qty 1

## 2013-09-29 MED ORDER — ONDANSETRON HCL 4 MG/2ML IJ SOLN
4.0000 mg | Freq: Once | INTRAMUSCULAR | Status: AC
  Administered 2013-09-29: 4 mg via INTRAVENOUS
  Filled 2013-09-29: qty 2

## 2013-09-29 MED ORDER — VANCOMYCIN HCL IN DEXTROSE 1-5 GM/200ML-% IV SOLN: 1000.0000 mg | Freq: Two times a day (BID) | INTRAVENOUS | Status: DC

## 2013-09-29 MED ORDER — VANCOMYCIN HCL IN DEXTROSE 1-5 GM/200ML-% IV SOLN
1000.0000 mg | Freq: Once | INTRAVENOUS | Status: AC
  Administered 2013-09-29: 1000 mg via INTRAVENOUS
  Filled 2013-09-29: qty 200

## 2013-09-29 MED ORDER — METHYLPREDNISOLONE SODIUM SUCC 125 MG IJ SOLR
125.0000 mg | Freq: Once | INTRAMUSCULAR | Status: AC
  Administered 2013-09-29: 125 mg via INTRAVENOUS
  Filled 2013-09-29: qty 2

## 2013-09-29 MED ORDER — MORPHINE SULFATE 4 MG/ML IJ SOLN
4.0000 mg | Freq: Once | INTRAMUSCULAR | Status: AC
Start: 2013-09-29 — End: 2013-09-29
  Administered 2013-09-29: 4 mg via INTRAVENOUS
  Filled 2013-09-29: qty 1

## 2013-09-29 MED ORDER — IOHEXOL 300 MG/ML  SOLN
100.0000 mL | Freq: Once | INTRAMUSCULAR | Status: AC | PRN
  Administered 2013-09-29: 100 mL via INTRAVENOUS

## 2013-09-29 NOTE — ED Notes (Signed)
Pt states she had some kind of abdominal surgery Monday ( possible cyst )by Dr. Evie Lacks. States she was bending over this morning and the incision popped open. Thick bloody drainage noted to left of incision

## 2013-09-29 NOTE — Progress Notes (Signed)
ANTIBIOTIC CONSULT NOTE - INITIAL  Pharmacy Consult for Vancomycin Indication: Cellulitis  Allergies  Allergen Reactions  . Levofloxacin   . Penicillins     Patient Measurements: Height: 5\' 4"  (162.6 cm) Weight: 138 lb (62.596 kg) IBW/kg (Calculated) : 54.7 Adjusted Body Weight:   Vital Signs: Temp: 99.1 F (37.3 C) (04/11 1241) Temp src: Oral (04/11 1241) BP: 117/71 mmHg (04/11 1241) Pulse Rate: 106 (04/11 1241) Intake/Output from previous day:   Intake/Output from this shift:    Labs:  Recent Labs  09/29/13 1409  WBC 9.7  HGB 12.0  PLT 160  CREATININE 0.78   Estimated Creatinine Clearance: 83.9 ml/min (by C-G formula based on Cr of 0.78). No results found for this basename: VANCOTROUGH, VANCOPEAK, VANCORANDOM, GENTTROUGH, GENTPEAK, GENTRANDOM, TOBRATROUGH, TOBRAPEAK, TOBRARND, AMIKACINPEAK, AMIKACINTROU, AMIKACIN,  in the last 72 hours   Microbiology: No results found for this or any previous visit (from the past 720 hour(s)).  Medical History: History reviewed. No pertinent past medical history.  Medications:  Scheduled:   Assessment: Abdominal surgery Monday, incision open. Bloody drainage. CrCl 83.9 ml/min  Goal of Therapy:  Vancomycin trough level 10-15 mcg/ml  Plan:  Vancomycin 1 GM IV every 12 hours Vancomycin trough at steady state Monitor renal function Labs per protocol  Jazyah Butsch [employer] 09/29/2013,2:51 PM

## 2013-09-29 NOTE — ED Provider Notes (Signed)
CSN: [id number]     Arrival date & time 09/29/13  1103 History   First MD Initiated Contact with Patient 09/29/13 1312    This chart was scribed for Nat Christen, MD by Terressa Koyanagi, ED Scribe. This patient was seen in room APA12/APA12 and the patient's care was started at 1:28 PM.  PCP: No PCP Per Patient  Chief Complaint  Patient presents with  . Drainage from Incision   The history is provided by the patient. No language interpreter was used.   HPI Comments: Level V caveat for urgent need for intervention. Hannah Gilmore is a 38 y.o. female who presents to the Emergency Department complaining of one of the incisions in the surgery site (suprapubic region) burst this morning when she bent over. Pt states she had surgery for an ovarian cyst removal on 09/24/13 by Dr. Mallie Darting in [place], [place].   Pt complains of associated erythema of her suprapubic region since yesterday. Pt also complains of associated fever; bleeding and discharge in the affected area.   History reviewed. No pertinent past medical history. Past Surgical History  Procedure Laterality Date  . Appendectomy    . Cholecystectomy    . Tonsillectomy    . Cesarean section    . Abdominal surgery     No family history on file. History  Substance Use Topics  . Smoking status: Current Every Day Smoker  . Smokeless tobacco: Not on file  . Alcohol Use: Yes   OB History   Grav Para Term Preterm Abortions TAB SAB Ect Mult Living                 Review of Systems  Constitutional: Positive for fever.  Skin:       horizontal laceration in the suprapubic area. Some surrounding erythema. Bloody discharge from laceration area.   A complete 10 system review of systems was obtained and all systems are negative except as noted in the HPI and PMH.   Allergies  Levofloxacin and Penicillins  Home Medications   Current Outpatient Rx  Name  Route  Sig  Dispense  Refill  . LORazepam (ATIVAN) 0.5 MG tablet   Oral   Take 0.5 mg by mouth  every 6 (six) hours as needed for anxiety.         Marland Kitchen oxyCODONE-acetaminophen (PERCOCET/ROXICET) 5-325 MG per tablet   Oral   Take by mouth every 4 (four) hours as needed for severe pain.          Triage Vitals: BP 117/71  Pulse 106  Temp(Src) 99.1 F (37.3 C) (Oral)  Resp 18  Ht 5\' 4"  (1.626 m)  Wt 138 lb (62.596 kg)  BMI 23.68 kg/m2  SpO2 100%  LMP 06/21/2013 Physical Exam  Nursing note and vitals reviewed. Constitutional:  Awake, alert, nontoxic appearance with baseline speech for patient.  HENT:  Head: Atraumatic.  Mouth/Throat: No oropharyngeal exudate.  Eyes: EOM are normal. Pupils are equal, round, and reactive to light. Right eye exhibits no discharge. Left eye exhibits no discharge.  Neck: Neck supple.  Cardiovascular: Normal rate and regular rhythm.   No murmur heard. Pulmonary/Chest: Effort normal and breath sounds normal. No stridor. No respiratory distress. She has no wheezes. She has no rales. She exhibits no tenderness.  Abdominal: Soft. Bowel sounds are normal. She exhibits no mass. There is tenderness (suprapubic ). There is no rebound.  Musculoskeletal: She exhibits no tenderness.  Baseline ROM, moves extremities with no obvious new focal weakness.  Lymphadenopathy:  She has no cervical adenopathy.  Neurological:  Awake, alert, cooperative and aware of situation; motor strength bilaterally; sensation normal to light touch bilaterally; peripheral visual fields full to confrontation; no facial asymmetry; tongue midline; major cranial nerves appear intact; no pronator drift, normal finger to nose bilaterally, baseline gait without new ataxia.  Skin: No rash noted.  8cm horizontal laceration in the suprapubic area. Some surrounding erythema particularly superiorly to the wound and a little inferiorly. Bloody serous drainage from laceration.   Psychiatric: She has a normal mood and affect.    ED Course  Procedures (including critical care time) DIAGNOSTIC  STUDIES: Oxygen Saturation is 100% on RA, normal by my interpretation.    COORDINATION OF CARE:  1:32 PM-Discussed treatment plan which includes imaging, antibiotics, labs and possible admittance to the hospital with pt at bedside and pt agreed to plan.   Labs Review Labs Reviewed  CBC WITH DIFFERENTIAL - Abnormal; Notable for the following:    HCT 33.3 (*)    Neutrophils Relative % 87 (*)    Neutro Abs 8.4 (*)    Lymphocytes Relative 6 (*)    Lymphs Abs 0.6 (*)    All other components within normal limits  BASIC METABOLIC PANEL - Abnormal; Notable for the following:    Glucose, Bld 103 (*)    All other components within normal limits  URINALYSIS, ROUTINE W REFLEX MICROSCOPIC - Abnormal; Notable for the following:    APPearance HAZY (*)    Specific Gravity, Urine >1.030 (*)    Bilirubin Urine SMALL (*)    Ketones, ur 15 (*)    All other components within normal limits   Imaging Review No results found.   EKG Interpretation None     CRITICAL CARE Performed by: Nat Christen Total critical care time: 30 Critical care time was exclusive of separately billable procedures and treating other patients. Critical care was necessary to treat or prevent imminent or life-threatening deterioration. Critical care was time spent personally by me on the following activities: development of treatment plan with patient and/or surrogate as well as nursing, discussions with consultants, evaluation of patient's response to treatment, examination of patient, obtaining history from patient or surrogate, ordering and performing treatments and interventions, ordering and review of laboratory studies, ordering and review of radiographic studies, pulse oximetry and re-evaluation of patient's condition. MDM   Final diagnoses:  Cellulitis, abdominal wall    Patient has obvious cellulitis surrounding abdominal wound. CT scan confirms same with also possible fasciitis with soft tissue air. Patient  initially prescribed vancomycin for which she had an allergic reaction.  Antibiotic changed to Zithromax IV. Pain management. Discussed with patient and her family. Transferred to Dr. Adah Perl at [facility] in [place].  I personally performed the services described in this documentation, which was scribed in my presence. The recorded information has been reviewed and is accurate.    Nat Christen, MD 09/29/13 2053

## 2013-09-29 NOTE — ED Notes (Signed)
Report given to Roney Jaffe, EMT. Pt transferred to [facility] via EMS.

## 2013-09-29 NOTE — Progress Notes (Signed)
Patient came to CT room with a Vancomycin drip and on the LEFT forearm there was a moderate rash with hives. Nurse, Jenny Reichmann was notified, drip stopped. MOB 1515

## 2013-09-29 NOTE — ED Notes (Signed)
Report called to [facility], [place], [place].

## 2014-03-17 ENCOUNTER — Emergency Department (HOSPITAL_COMMUNITY)
Admission: EM | Admit: 2014-03-17 | Discharge: 2014-03-17 | Disposition: A | Discharge status: Home / Self Care | Payer: Medicaid Other | Attending: Emergency Medicine | Admitting: Emergency Medicine

## 2014-03-17 ENCOUNTER — Emergency Department (HOSPITAL_COMMUNITY): Payer: Medicaid Other

## 2014-03-17 ENCOUNTER — Encounter (HOSPITAL_COMMUNITY): Payer: Self-pay | Admitting: Emergency Medicine

## 2014-03-17 DIAGNOSIS — S59909A Unspecified injury of unspecified elbow, initial encounter: Secondary | ICD-10-CM | POA: Insufficient documentation

## 2014-03-17 DIAGNOSIS — Y929 Unspecified place or not applicable: Secondary | ICD-10-CM | POA: Diagnosis not present

## 2014-03-17 DIAGNOSIS — S5010XA Contusion of unspecified forearm, initial encounter: Secondary | ICD-10-CM | POA: Insufficient documentation

## 2014-03-17 DIAGNOSIS — F17200 Nicotine dependence, unspecified, uncomplicated: Secondary | ICD-10-CM | POA: Insufficient documentation

## 2014-03-17 DIAGNOSIS — S59919A Unspecified injury of unspecified forearm, initial encounter: Secondary | ICD-10-CM

## 2014-03-17 DIAGNOSIS — Z880 Allergy status to penicillin: Secondary | ICD-10-CM | POA: Diagnosis not present

## 2014-03-17 DIAGNOSIS — Y9351 Activity, roller skating (inline) and skateboarding: Secondary | ICD-10-CM | POA: Diagnosis not present

## 2014-03-17 DIAGNOSIS — S0012XA Contusion of left eyelid and periocular area, initial encounter: Secondary | ICD-10-CM

## 2014-03-17 DIAGNOSIS — S0993XA Unspecified injury of face, initial encounter: Secondary | ICD-10-CM | POA: Diagnosis not present

## 2014-03-17 DIAGNOSIS — S199XXA Unspecified injury of neck, initial encounter: Secondary | ICD-10-CM

## 2014-03-17 DIAGNOSIS — R110 Nausea: Secondary | ICD-10-CM | POA: Diagnosis not present

## 2014-03-17 DIAGNOSIS — S5011XA Contusion of right forearm, initial encounter: Secondary | ICD-10-CM

## 2014-03-17 DIAGNOSIS — S0510XA Contusion of eyeball and orbital tissues, unspecified eye, initial encounter: Secondary | ICD-10-CM | POA: Diagnosis not present

## 2014-03-17 DIAGNOSIS — S6990XA Unspecified injury of unspecified wrist, hand and finger(s), initial encounter: Secondary | ICD-10-CM

## 2014-03-17 MED ORDER — IBUPROFEN 600 MG PO TABS: 600.0000 mg | ORAL_TABLET | Freq: Three times a day (TID) | ORAL | Status: DC | PRN

## 2014-03-17 MED ORDER — ONDANSETRON 8 MG PO TBDP
8.0000 mg | ORAL_TABLET | Freq: Once | ORAL | Status: AC
  Administered 2014-03-17: 8 mg via ORAL
  Filled 2014-03-17: qty 1

## 2014-03-17 MED ORDER — HYDROCODONE-ACETAMINOPHEN 5-325 MG PO TABS
1.0000 | ORAL_TABLET | Freq: Once | ORAL | Status: AC
  Administered 2014-03-17: 1 via ORAL
  Filled 2014-03-17: qty 1

## 2014-03-17 MED ORDER — HYDROCODONE-ACETAMINOPHEN 5-325 MG PO TABS: 1.0000 | ORAL_TABLET | ORAL | Status: DC | PRN

## 2014-03-17 NOTE — ED Notes (Signed)
Pt was skating earlier this morning and fell left eye bruising and right wrist bruising, pain and swelling. Denies LOC

## 2014-03-17 NOTE — Discharge Instructions (Signed)
Contusion A contusion is a deep bruise. Contusions are the result of an injury that caused bleeding under the skin. The contusion may turn blue, purple, or yellow. Minor injuries will give you a painless contusion, but more severe contusions may stay painful and swollen for a few weeks.  CAUSES  A contusion is usually caused by a blow, trauma, or direct force to an area of the body. SYMPTOMS   Swelling and redness of the injured area.  Bruising of the injured area.  Tenderness and soreness of the injured area.  Pain. DIAGNOSIS  The diagnosis can be made by taking a history and physical exam. An X-ray, CT scan, or MRI may be needed to determine if there were any associated injuries, such as fractures. TREATMENT  Specific treatment will depend on what area of the body was injured. In general, the best treatment for a contusion is resting, icing, elevating, and applying cold compresses to the injured area. Over-the-counter medicines may also be recommended for pain control. Ask your caregiver what the best treatment is for your contusion. HOME CARE INSTRUCTIONS   Put ice on the injured area.  Put ice in a plastic bag.  Place a towel between your skin and the bag.  Leave the ice on for 15-20 minutes, 3-4 times a day, or as directed by your health care provider.  Only take over-the-counter or prescription medicines for pain, discomfort, or fever as directed by your caregiver. Your caregiver may recommend avoiding anti-inflammatory medicines (aspirin, ibuprofen, and naproxen) for 48 hours because these medicines may increase bruising.  Rest the injured area.  If possible, elevate the injured area to reduce swelling. SEEK IMMEDIATE MEDICAL CARE IF:   You have increased bruising or swelling.  You have pain that is getting worse.  Your swelling or pain is not relieved with medicines. MAKE SURE YOU:   Understand these instructions.  Will watch your condition.  Will get help right  away if you are not doing well or get worse. Document Released: 03/17/2005 Document Revised: 06/12/2013 Document Reviewed: 04/12/2011 [facility] Patient Information 2015 [place], [place]. This information is not intended to replace advice given to you by your health care provider. Make sure you discuss any questions you have with your health care provider.   You may start using heat therapy starting on Wednesday as discussed.  Only use intermittent ice packs until Wednesday.  Use the medications prescribed for pain and swelling.  Do not drive within 4 hours of taking hydrocodone as this medication may make you sleepy.

## 2014-03-17 NOTE — ED Provider Notes (Signed)
CSN: [id number]     Arrival date & time 03/17/14  1141 History  This chart was scribed for Hannah Jefferson, PA, working with Janice Norrie, MD found by Starleen Arms, ED Scribe. This patient was seen in room APFT24/APFT24 and the patient's care was started at 1:12 PM.   Chief Complaint  Patient presents with  . Fall   The history is provided by the patient. No language interpreter was used.    HPI Comments: Hannah Gilmore is a 38 y.o. female who presents to the Emergency Department complaining of a mechanical fall that occurred earlier today while roller blading.  Patient reports after the fall she caught herself with her right wrist and her right forearm was subsequently run over by another skater's roller blade.   She reports associated pain and swelling in her wrist and forearm after the fall.  She also states that she hit her right eye on a fellow skater's knee during the fall.  Patient also complains of minor, unchanged, constant neck pain.  Patient denies head trauma, LOC, weakness or numbness in her upper extremities. She has developed mild nausea since arrival here.  Patient states she has a history of anemia but denies other medical conditions.  Patient denies vision changes, weakness.    History reviewed. No pertinent past medical history. Past Surgical History  Procedure Laterality Date  . Appendectomy    . Cholecystectomy    . Tonsillectomy    . Cesarean section    . Abdominal surgery     History reviewed. No pertinent family history. History  Substance Use Topics  . Smoking status: Current Every Day Smoker -- 1.00 packs/day    Types: Cigarettes  . Smokeless tobacco: Not on file  . Alcohol Use: No   OB History   Grav Para Term Preterm Abortions TAB SAB Ect Mult Living                 Review of Systems  Constitutional: Negative for fever.  HENT: Negative for congestion and sore throat.   Eyes: Negative.  Negative for pain, redness and visual disturbance.  Respiratory:  Negative for chest tightness and shortness of breath.   Cardiovascular: Negative for chest pain.  Gastrointestinal: Positive for nausea. Negative for vomiting and abdominal pain.  Genitourinary: Negative.   Musculoskeletal: Positive for arthralgias and neck pain. Negative for joint swelling.  Skin: Negative.  Negative for rash and wound.  Neurological: Negative for dizziness, weakness, light-headedness, numbness and headaches.  Psychiatric/Behavioral: Negative.       Allergies  Levofloxacin and Penicillins  Home Medications   Prior to Admission medications   Medication Sig Start Date End Date Taking? Authorizing Provider  HYDROcodone-acetaminophen (NORCO/VICODIN) 5-325 MG per tablet Take 1 tablet by mouth every 4 (four) hours as needed. 03/17/14   Hannah Jefferson, PA-C  ibuprofen (ADVIL,MOTRIN) 600 MG tablet Take 1 tablet (600 mg total) by mouth every 8 (eight) hours as needed. 03/17/14   Hannah Jefferson, PA-C  LORazepam (ATIVAN) 0.5 MG tablet Take 0.5 mg by mouth every 6 (six) hours as needed for anxiety.    Historical Provider, MD  oxyCODONE-acetaminophen (PERCOCET/ROXICET) 5-325 MG per tablet Take by mouth every 4 (four) hours as needed for severe pain.    Historical Provider, MD   BP 114/62  Pulse 95  Temp(Src) 98.2 F (36.8 C) (Oral)  Resp 16  Ht 5\' 4"  (1.626 m)  Wt 135 lb (61.236 kg)  BMI 23.16 kg/m2  SpO2 100%  LMP 02/15/2014  Physical Exam  Nursing note and vitals reviewed. Constitutional: She appears well-developed and well-nourished.  HENT:  Head: Normocephalic.  Bruising and hematoma along left brow.  Tender without obvious deformity.  No stepoffs.  Eyes: Conjunctivae and EOM are normal. Pupils are equal, round, and reactive to light.  No globe injury.  Neck: Normal range of motion. Spinous process tenderness present.  Mild mid-line cervical tenderness   Cardiovascular: Normal rate, regular rhythm, normal heart sounds and intact distal pulses.   Pulmonary/Chest: Effort  normal and breath sounds normal. She has no wheezes.  Abdominal: Soft. Bowel sounds are normal. There is no tenderness.  Musculoskeletal: Normal range of motion. She exhibits edema.  Bruising/edema on volar surface of right distal forearm.  Pain that radiates up to the right elbow but no pain in the elbow with flex/ex of the elbow.  Shoulders are non-tender.    Neurological: She is alert.  Skin: Skin is warm and dry.  Psychiatric: She has a normal mood and affect.    ED Course  Procedures (including critical care time)  DIAGNOSTIC STUDIES: Oxygen Saturation is 100% on RA, normal by my interpretation.    COORDINATION OF CARE:  1:20 PM Will order imaging, anti-emetics, pain medication.  Patient acknowledges and agrees with plan.    Labs Review Labs Reviewed - No data to display  Imaging Review No results found.   EKG Interpretation None       MDM   Final diagnoses:  Forearm contusion, right, initial encounter  Eyebrow contusion, left, initial encounter    Patients labs and/or radiological studies were viewed and considered during the medical decision making and disposition process. Ct and plain films reviewed.   Pt was given jones dressing, sling for tx of right forearm contusion. Encouraged ice, elevation.  Hydrocodone, ibuprofen prescribed.  F/u if not improving with tx over the next week.  Referral given.  Discussed signs/sx of compartment syndrome and need for immediate recheck for worsened pain, swelling in forearm.    I personally performed the services described in this documentation, which was scribed in my presence. The recorded information has been reviewed and is accurate.   Hannah Jefferson, PA-C 03/19/14 1801

## 2014-03-21 NOTE — ED Provider Notes (Signed)
Medical screening examination/treatment/procedure(s) were performed by non-physician practitioner and as supervising physician I was immediately available for consultation/collaboration.   EKG Interpretation None      Rolland Porter, MD, Abram Sander   Janice Norrie, MD 03/21/14 [phone]

## 2014-04-11 ENCOUNTER — Encounter (HOSPITAL_COMMUNITY): Payer: Self-pay | Admitting: *Deleted

## 2014-04-11 ENCOUNTER — Inpatient Hospital Stay (HOSPITAL_COMMUNITY)
Admission: AD | Admit: 2014-04-11 | Discharge: 2014-04-11 | Disposition: A | Discharge status: Home / Self Care | Payer: Medicaid Other | Source: Ambulatory Visit | Attending: Obstetrics and Gynecology | Admitting: Obstetrics and Gynecology

## 2014-04-11 DIAGNOSIS — O99331 Smoking (tobacco) complicating pregnancy, first trimester: Secondary | ICD-10-CM | POA: Insufficient documentation

## 2014-04-11 DIAGNOSIS — F17210 Nicotine dependence, cigarettes, uncomplicated: Secondary | ICD-10-CM | POA: Insufficient documentation

## 2014-04-11 DIAGNOSIS — R42 Dizziness and giddiness: Secondary | ICD-10-CM

## 2014-04-11 DIAGNOSIS — E860 Dehydration: Secondary | ICD-10-CM | POA: Diagnosis not present

## 2014-04-11 DIAGNOSIS — Z3201 Encounter for pregnancy test, result positive: Secondary | ICD-10-CM | POA: Insufficient documentation

## 2014-04-11 DIAGNOSIS — O219 Vomiting of pregnancy, unspecified: Secondary | ICD-10-CM

## 2014-04-11 HISTORY — DX: Complete placenta previa nos or without hemorrhage, unspecified trimester: O44.00

## 2014-04-11 LAB — WET PREP, GENITAL
TRICH WET PREP: NONE SEEN
WBC, Wet Prep HPF POC: NONE SEEN
Yeast Wet Prep HPF POC: NONE SEEN

## 2014-04-11 LAB — POCT PREGNANCY, URINE: Preg Test, Ur: POSITIVE — AB

## 2014-04-11 LAB — URINALYSIS, ROUTINE W REFLEX MICROSCOPIC
BILIRUBIN URINE: NEGATIVE
GLUCOSE, UA: NEGATIVE mg/dL
HGB URINE DIPSTICK: NEGATIVE
KETONES UR: NEGATIVE mg/dL
Leukocytes, UA: NEGATIVE
NITRITE: NEGATIVE
PH: 6 (ref 5.0–8.0)
Protein, ur: NEGATIVE mg/dL
SPECIFIC GRAVITY, URINE: 1.01 (ref 1.005–1.030)
Urobilinogen, UA: 0.2 mg/dL (ref 0.0–1.0)

## 2014-04-11 LAB — CBC WITH DIFFERENTIAL/PLATELET
Basophils Absolute: 0 10*3/uL (ref 0.0–0.1)
Basophils Relative: 0 % (ref 0–1)
EOS PCT: 2 % (ref 0–5)
Eosinophils Absolute: 0.1 10*3/uL (ref 0.0–0.7)
HEMATOCRIT: 37 % (ref 36.0–46.0)
Hemoglobin: 13.2 g/dL (ref 12.0–15.0)
LYMPHS ABS: 1.2 10*3/uL (ref 0.7–4.0)
LYMPHS PCT: 19 % (ref 12–46)
MCH: 30.8 pg (ref 26.0–34.0)
MCHC: 35.7 g/dL (ref 30.0–36.0)
MCV: 86.2 fL (ref 78.0–100.0)
MONOS PCT: 4 % (ref 3–12)
Monocytes Absolute: 0.2 10*3/uL (ref 0.1–1.0)
Neutro Abs: 4.9 10*3/uL (ref 1.7–7.7)
Neutrophils Relative %: 75 % (ref 43–77)
PLATELETS: 141 10*3/uL — AB (ref 150–400)
RBC: 4.29 MIL/uL (ref 3.87–5.11)
RDW: 12.8 % (ref 11.5–15.5)
WBC: 6.5 10*3/uL (ref 4.0–10.5)

## 2014-04-11 LAB — BASIC METABOLIC PANEL
ANION GAP: 10 (ref 5–15)
BUN: 9 mg/dL (ref 6–23)
CHLORIDE: 100 meq/L (ref 96–112)
CO2: 25 meq/L (ref 19–32)
Calcium: 8.8 mg/dL (ref 8.4–10.5)
Creatinine, Ser: 0.58 mg/dL (ref 0.50–1.10)
GFR calc non Af Amer: >90 mL/min (ref >90)
Glucose, Bld: 79 mg/dL (ref 70–99)
POTASSIUM: 4.3 meq/L (ref 3.7–5.3)
SODIUM: 135 meq/L — AB (ref 137–147)

## 2014-04-11 LAB — HIV ANTIBODY (ROUTINE TESTING W REFLEX): HIV: NONREACTIVE

## 2014-04-11 LAB — RPR

## 2014-04-11 MED ORDER — PROMETHAZINE HCL 25 MG PO TABS
25.0000 mg | ORAL_TABLET | ORAL | Status: DC
  Filled 2014-04-11: qty 1

## 2014-04-11 MED ORDER — PROMETHAZINE HCL 25 MG PO TABS: 12.5000 mg | ORAL_TABLET | Freq: Four times a day (QID) | ORAL | Status: DC | PRN

## 2014-04-11 MED ORDER — NICOTINE 21 MG/24HR TD PT24: 21.0000 mg | MEDICATED_PATCH | Freq: Every day | TRANSDERMAL | Status: DC

## 2014-04-11 NOTE — MAU Note (Signed)
Patient states she has had a positive home pregnancy test. States she has periods of feeling dizzy and headaches. Has mild lower abdominal pressure, no bleeding or discharge. Has had nausea for about one month, no vomiting. Marland Kitchen

## 2014-04-11 NOTE — MAU Provider Note (Signed)
History     CSN: [id number]  Arrival date and time: 04/11/14 1112   None     Chief Complaint  Patient presents with  . Possible Pregnancy  . Dizziness  . Headache   Possible Pregnancy This is a new problem. Associated symptoms include headaches and nausea. Pertinent negatives include no chest pain, chills, fever or vomiting. Associated symptoms comments: Dizziness .  Dizziness This is a new problem. The current episode started yesterday. The problem occurs intermittently. Associated symptoms include headaches and nausea. Pertinent negatives include no chest pain, chills, fever or vomiting. The symptoms are aggravated by standing. She has tried nothing for the symptoms.  Headache  This is a new problem. The current episode started yesterday. The pain is located in the bilateral region. The pain does not radiate. The pain quality is not similar to prior headaches. The quality of the pain is described as aching. The pain is moderate. Associated symptoms include dizziness and nausea. Pertinent negatives include no blurred vision, ear pain, eye pain, fever, hearing loss or vomiting. The symptoms are aggravated by food. She has tried nothing for the symptoms.    Hannah Gilmore is a 38 y/o G35P7 female presenting today complaining of dizziness, HA, nausea, and abdominal pressure. She reports feeling dizzy with standing over the past 2 days. She also endorses a headache for the past 2 days. This morning when she stood up she says that her vision went black for about a minute while she regained her balance. She reports an episode of syncope 2 weeks ago but none since. She endorses limited fluid intake of 2-3 glasses of water per day over the past month due to nausea. She denies any vomiting. Additionally, she endorses some lower abdominal pressure but denies any pain.   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 She also reports blood clots following her 2nd to last pregnancy and was  treated with daily lovenox injections during her most recent pregnancy. She also says that she is motivated to quit smoking during this pregnancy. She says she has cut back to 1 1/2 packs a day from 2 packs.    OB History   Grav Para Term Preterm Abortions TAB SAB Ect Mult Living   _0 Past Medical History  Diagnosis Date  . Placenta previa     previous 2 pregnancy    Past Surgical History  Procedure Laterality Date  . Appendectomy    . Cholecystectomy    . Tonsillectomy    . Abdominal surgery    . Cesarean section      x2    No family history on file.  History  Substance Use Topics  . Smoking status: Current Every Day Smoker -- 1.00 packs/day    Types: Cigarettes  . Smokeless tobacco: Not on file  . Alcohol Use: No    Allergies:  Allergies  Allergen Reactions  . Levofloxacin Hives and Rash  . Penicillins Hives and Rash    Prescriptions prior to admission  Medication Sig Dispense Refill  . Prenatal Vit-Fe Fumarate-FA (PRENATAL MULTIVITAMIN) TABS tablet Take 1 tablet by mouth daily at 12 noon.        Review of Systems  Constitutional: Negative for fever and chills.  HENT: Negative for ear discharge, ear pain and hearing loss.   Eyes: Negative for blurred vision, double vision and pain.       Black  in vision when standing   Respiratory: Negative for hemoptysis.   Cardiovascular: Negative for chest pain and palpitations.  Gastrointestinal: Positive for nausea. Negative for vomiting, diarrhea, constipation and blood in stool.  Neurological: Positive for dizziness, loss of consciousness and headaches. Negative for sensory change.       LOC 2 weeks ago   Physical Exam   Blood pressure 124/69, pulse 73, temperature 98 F (36.7 C), temperature source Oral, resp. rate 16, height _0  (1.6 m), weight 60.056 kg (132 lb 6.4 oz), last menstrual period 01/15/2014, SpO2 100.00%.  Physical Exam  Constitutional: She is oriented to person, place, and  time. She appears well-developed and well-nourished.  Eyes: Conjunctivae and EOM are normal. Pupils are equal, round, and reactive to light. Right eye exhibits no discharge. Left eye exhibits no discharge. No scleral icterus.  Neck: Normal range of motion. Neck supple.  Cardiovascular: Normal rate, regular rhythm and normal heart sounds.  Exam reveals no gallop and no friction rub.   No murmur heard. Respiratory: Effort normal and breath sounds normal. No respiratory distress. She has no wheezes. She has no rales.  GI: Soft. Bowel sounds are normal. She exhibits no distension and no mass. There is no tenderness. There is no rebound and no guarding.  Genitourinary: There is no rash, tenderness or lesion on the right labia. There is no rash, tenderness or lesion on the left labia. Uterus is tender. Cervix exhibits no motion tenderness, no discharge and no friability. Right adnexum displays no mass, no tenderness and no fullness. Left adnexum displays no mass, no tenderness and no fullness. No erythema or bleeding around the vagina. No signs of injury around the vagina.  Few white patches of mucus along vaginal wall.   Musculoskeletal: She exhibits no edema.  Lymphadenopathy:    She has no cervical adenopathy.  Neurological: She is alert and oriented to person, place, and time. No cranial nerve deficit.  Skin: Skin is warm and dry.  Psychiatric: She has a normal mood and affect.   Results for orders placed during the hospital encounter of 04/11/14 (from the past 48 hour(s))  URINALYSIS, ROUTINE W REFLEX MICROSCOPIC     Status: None   Collection Time    04/11/14 12:34 PM      Result Value Ref Range   Color, Urine YELLOW  YELLOW   APPearance CLEAR  CLEAR   Specific Gravity, Urine 1.010  1.005 - 1.030   pH 6.0  5.0 - 8.0   Glucose, UA NEGATIVE  NEGATIVE mg/dL   Hgb urine dipstick NEGATIVE  NEGATIVE   Bilirubin Urine NEGATIVE  NEGATIVE   Ketones, ur NEGATIVE  NEGATIVE mg/dL   Protein, ur  NEGATIVE  NEGATIVE mg/dL   Urobilinogen, UA 0.2  0.0 - 1.0 mg/dL   Nitrite NEGATIVE  NEGATIVE   Leukocytes, UA NEGATIVE  NEGATIVE   Comment: MICROSCOPIC NOT DONE ON URINES WITH NEGATIVE PROTEIN, BLOOD, LEUKOCYTES, NITRITE, OR GLUCOSE <1000 mg/dL.  POCT PREGNANCY, URINE     Status: Abnormal   Collection Time    04/11/14 12:52 PM      Result Value Ref Range   Preg Test, Ur POSITIVE (*) NEGATIVE   Comment:            THE SENSITIVITY OF THIS     METHODOLOGY IS >24 mIU/mL  CBC WITH DIFFERENTIAL     Status: Abnormal   Collection Time    04/11/14  2:03 PM      Result Value Ref  Range   WBC 6.5  4.0 - 10.5 K/uL   RBC 4.29  3.87 - 5.11 MIL/uL   Hemoglobin 13.2  12.0 - 15.0 g/dL   HCT 61.1  64.3 - 53.9 %   MCV 86.2  78.0 - 100.0 fL   MCH 30.8  26.0 - 34.0 pg   MCHC 35.7  30.0 - 36.0 g/dL   RDW 12.2  58.3 - 46.2 %   Platelets 141 (*) 150 - 400 K/uL   Neutrophils Relative % 75  43 - 77 %   Neutro Abs 4.9  1.7 - 7.7 K/uL   Lymphocytes Relative 19  12 - 46 %   Lymphs Abs 1.2  0.7 - 4.0 K/uL   Monocytes Relative 4  3 - 12 %   Monocytes Absolute 0.2  0.1 - 1.0 K/uL   Eosinophils Relative 2  0 - 5 %   Eosinophils Absolute 0.1  0.0 - 0.7 K/uL   Basophils Relative 0  0 - 1 %   Basophils Absolute 0.0  0.0 - 0.1 K/uL  BASIC METABOLIC PANEL     Status: Abnormal   Collection Time    04/11/14  2:03 PM      Result Value Ref Range   Sodium 135 (*) 137 - 147 mEq/L   Potassium 4.3  3.7 - 5.3 mEq/L   Chloride 100  96 - 112 mEq/L   CO2 25  19 - 32 mEq/L   Glucose, Bld 79  70 - 99 mg/dL   BUN 9  6 - 23 mg/dL   Creatinine, Ser 1.94  0.50 - 1.10 mg/dL   Calcium 8.8  8.4 - 71.2 mg/dL   GFR calc non Af Amer >90  >90 mL/min   GFR calc Af Amer >90  >90 mL/min   Comment: (NOTE)     The eGFR has been calculated using the CKD EPI equation.     This calculation has not been validated in all clinical situations.     eGFR's persistently <90 mL/min signify possible Chronic Kidney     Disease.   Anion gap  10  5 - 15  HIV ANTIBODY (ROUTINE TESTING)     Status: None   Collection Time    04/11/14  3:35 PM      Result Value Ref Range   HIV 1&2 Ab, 4th Generation NONREACTIVE  NONREACTIVE   Comment: (NOTE)     A NONREACTIVE HIV Ag/Ab result does not exclude HIV infection since     the time frame for seroconversion is variable. If acute HIV infection     is suspected, a HIV-1 RNA Qualitative TMA test is recommended.     HIV-1/2 Antibody Diff         Not indicated.     HIV-1 RNA, Qual TMA           Not indicated.     PLEASE NOTE: This information has been disclosed to you from records     whose confidentiality may be protected by state law. If your state     requires such protection, then the state law prohibits you from making     any further disclosure of the information without the specific written     consent of the person to whom it pertains, or as otherwise permitted     by law. A general authorization for the release of medical or other     information is NOT sufficient for this purpose.     The  performance of this assay has not been clinically validated in     patients less than 49 years old.     Performed at [facility]  RPR     Status: None   Collection Time    04/11/14  3:35 PM      Result Value Ref Range   RPR NON REAC  NON REAC   Comment: Performed at [facility], [facility]     Status: Abnormal   Collection Time    04/11/14  3:40 PM      Result Value Ref Range   Yeast Wet Prep HPF POC NONE SEEN  NONE SEEN   Trich, Wet Prep NONE SEEN  NONE SEEN   Clue Cells Wet Prep HPF POC FEW (*) NONE SEEN   WBC, Wet Prep HPF POC NONE SEEN  NONE SEEN   Comment: MODERATE BACTERIA SEEN    MAU Course  Procedures  CMP to assess for dehydration: WNL CBC to assess for infection: WNL U/A: WNL Urine pregnancy test: positive  Wet prep: few clue cells  FHT: 143 bpm   Assessment and Plan  A: 38 y/o G50P7 female with IUP and dehydration   P: Encourage fluid intake   - OTC Unisom and vit B6 - Phenergan 25 mg PO Q 6 hours, Rx sent to pharmacy - Nicotine patch to help with smoking  - Followup for prenatal care  Bruce Donath 04/11/2014, 4:00 PM   I have seen this patient and agree with the above PA student's note.  [place], [place] Certified Nurse-Midwife

## 2014-04-11 NOTE — Discharge Instructions (Signed)
Nausea medication to take during pregnancy:   Unisom (doxylamine succinate 25 mg tablets) Take one tablet daily at bedtime. If symptoms are not adequately controlled, the dose can be increased to a maximum recommended dose of two tablets daily (1/2 tablet in the morning, 1/2 tablet mid-afternoon and one at bedtime).  Vitamin B6 100mg  tablets. Take one tablet twice a day (up to 200 mg per day).  Add Phenergan as prescribed to take as needed.   Dizziness Dizziness is a common problem. It is a feeling of unsteadiness or light-headedness. You may feel like you are about to faint. Dizziness can lead to injury if you stumble or fall. A person of any age group can suffer from dizziness, but dizziness is more common in older adults. CAUSES  Dizziness can be caused by many different things, including:  Middle ear problems.  Standing for too long.  Infections.  An allergic reaction.  Aging.  An emotional response to something, such as the sight of blood.  Side effects of medicines.  Tiredness.  Problems with circulation or blood pressure.  Excessive use of alcohol or medicines, or illegal drug use.  Breathing too fast (hyperventilation).  An irregular heart rhythm (arrhythmia).  A low red blood cell count (anemia).  Pregnancy.  Vomiting, diarrhea, fever, or other illnesses that cause body fluid loss (dehydration).  Diseases or conditions such as Parkinson's disease, high blood pressure (hypertension), diabetes, and thyroid problems.  Exposure to extreme heat. DIAGNOSIS  Your health care provider will ask about your symptoms, perform a physical exam, and perform an electrocardiogram (ECG) to record the electrical activity of your heart. Your health care provider may also perform other heart or blood tests to determine the cause of your dizziness. These may include:  Transthoracic echocardiogram (TTE). During echocardiography, sound waves are used to evaluate how blood flows  through your heart.  Transesophageal echocardiogram (TEE).  Cardiac monitoring. This allows your health care provider to monitor your heart rate and rhythm in real time.  Holter monitor. This is a portable device that records your heartbeat and can help diagnose heart arrhythmias. It allows your health care provider to track your heart activity for several days if needed.  Stress tests by exercise or by giving medicine that makes the heart beat faster. TREATMENT  Treatment of dizziness depends on the cause of your symptoms and can vary greatly. HOME CARE INSTRUCTIONS   Drink enough fluids to keep your urine clear or pale yellow. This is especially important in very hot weather. In older adults, it is also important in cold weather.  Take your medicine exactly as directed if your dizziness is caused by medicines. When taking blood pressure medicines, it is especially important to get up slowly.  Rise slowly from chairs and steady yourself until you feel okay.  In the morning, first sit up on the side of the bed. When you feel okay, stand slowly while holding onto something until you know your balance is fine.  Move your legs often if you need to stand in one place for a long time. Tighten and relax your muscles in your legs while standing.  Have someone stay with you for 1-2 days if dizziness continues to be a problem. Do this until you feel you are well enough to stay alone. Have the person call your health care provider if he or she notices changes in you that are concerning.  Do not drive or use heavy machinery if you feel dizzy.  Do not  drink alcohol. SEEK IMMEDIATE MEDICAL CARE IF:   Your dizziness or light-headedness gets worse.  You feel nauseous or vomit.  You have problems talking, walking, or using your arms, hands, or legs.  You feel weak.  You are not thinking clearly or you have trouble forming sentences. It may take a friend or family member to notice this.  You  have chest pain, abdominal pain, shortness of breath, or sweating.  Your vision changes.  You notice any bleeding.  You have side effects from medicine that seems to be getting worse rather than better. MAKE SURE YOU:   Understand these instructions.  Will watch your condition.  Will get help right away if you are not doing well or get worse. Document Released: 12/01/2000 Document Revised: 06/12/2013 Document Reviewed: 12/25/2010 [facility] Patient Information 2015 [place], [place]. This information is not intended to replace advice given to you by your health care provider. Make sure you discuss any questions you have with your health care provider.  Morning Sickness Morning sickness is when you feel sick to your stomach (nauseous) during pregnancy. This nauseous feeling may or may not come with vomiting. It often occurs in the morning but can be a problem any time of day. Morning sickness is most common during the first trimester, but it may continue throughout pregnancy. While morning sickness is unpleasant, it is usually harmless unless you develop severe and continual vomiting (hyperemesis gravidarum). This condition requires more intense treatment.  CAUSES  The cause of morning sickness is not completely known but seems to be related to normal hormonal changes that occur in pregnancy. RISK FACTORS You are at greater risk if you:  Experienced nausea or vomiting before your pregnancy.  Had morning sickness during a previous pregnancy.  Are pregnant with more than one baby, such as twins. TREATMENT  Do not use any medicines (prescription, over-the-counter, or herbal) for morning sickness without first talking to your health care provider. Your health care provider may prescribe or recommend:  Vitamin B6 supplements.  Anti-nausea medicines.  The herbal medicine ginger. HOME CARE INSTRUCTIONS   Only take over-the-counter or prescription medicines as directed by your health care  provider.  Taking multivitamins before getting pregnant can prevent or decrease the severity of morning sickness in most women.  Eat a piece of dry toast or unsalted crackers before getting out of bed in the morning.  Eat five or six small meals a day.  Eat dry and bland foods (rice, baked potato). Foods high in carbohydrates are often helpful.  Do not drink liquids with your meals. Drink liquids between meals.  Avoid greasy, fatty, and spicy foods.  Get someone to cook for you if the smell of any food causes nausea and vomiting.  If you feel nauseous after taking prenatal vitamins, take the vitamins at night or with a snack.  Snack on protein foods (nuts, yogurt, cheese) between meals if you are hungry.  Eat unsweetened gelatins for desserts.  Wearing an acupressure wristband (worn for sea sickness) may be helpful.  Acupuncture may be helpful.  Do not smoke.  Get a humidifier to keep the air in your house free of odors.  Get plenty of fresh air. SEEK MEDICAL CARE IF:   Your home remedies are not working, and you need medicine.  You feel dizzy or lightheaded.  You are losing weight. SEEK IMMEDIATE MEDICAL CARE IF:   You have persistent and uncontrolled nausea and vomiting.  You pass out (faint). MAKE SURE YOU:  Understand these instructions.  Will watch your condition.  Will get help right away if you are not doing well or get worse. Document Released: 07/29/2006 Document Revised: 06/12/2013 Document Reviewed: 11/22/2012 [facility] Patient Information 2015 [place], [place]. This information is not intended to replace advice given to you by your health care provider. Make sure you discuss any questions you have with your health care provider.

## 2014-04-12 LAB — GC/CHLAMYDIA PROBE AMP
CT Probe RNA: NEGATIVE
GC PROBE AMP APTIMA: NEGATIVE

## 2014-04-15 NOTE — MAU Provider Note (Signed)
Attestation of Attending Supervision of Advanced Practitioner (CNM/NP): Evaluation and management procedures were performed by the Advanced Practitioner under my supervision and collaboration.  I have reviewed the Advanced Practitioner's note and chart, and I agree with the management and plan.  Lynnlee Revels 04/15/2014 1:16 PM

## 2014-04-19 ENCOUNTER — Encounter (HOSPITAL_COMMUNITY): Payer: Self-pay | Admitting: *Deleted

## 2014-04-19 ENCOUNTER — Inpatient Hospital Stay (HOSPITAL_COMMUNITY)
Admission: AD | Admit: 2014-04-19 | Discharge: 2014-04-19 | Disposition: A | Discharge status: Home / Self Care | Payer: Medicaid Other | Source: Ambulatory Visit | Attending: Obstetrics & Gynecology | Admitting: Obstetrics & Gynecology

## 2014-04-19 DIAGNOSIS — M791 Myalgia: Secondary | ICD-10-CM | POA: Insufficient documentation

## 2014-04-19 DIAGNOSIS — O99612 Diseases of the digestive system complicating pregnancy, second trimester: Secondary | ICD-10-CM

## 2014-04-19 DIAGNOSIS — K5900 Constipation, unspecified: Secondary | ICD-10-CM | POA: Diagnosis not present

## 2014-04-19 DIAGNOSIS — O26891 Other specified pregnancy related conditions, first trimester: Secondary | ICD-10-CM | POA: Diagnosis present

## 2014-04-19 DIAGNOSIS — F17200 Nicotine dependence, unspecified, uncomplicated: Secondary | ICD-10-CM | POA: Insufficient documentation

## 2014-04-19 DIAGNOSIS — Z3A12 12 weeks gestation of pregnancy: Secondary | ICD-10-CM | POA: Diagnosis not present

## 2014-04-19 DIAGNOSIS — R42 Dizziness and giddiness: Secondary | ICD-10-CM | POA: Diagnosis not present

## 2014-04-19 DIAGNOSIS — M7918 Myalgia, other site: Secondary | ICD-10-CM

## 2014-04-19 HISTORY — DX: Other specified health status: Z78.9

## 2014-04-19 LAB — RAPID URINE DRUG SCREEN, HOSP PERFORMED
AMPHETAMINES: NOT DETECTED
BARBITURATES: NOT DETECTED
BENZODIAZEPINES: NOT DETECTED
COCAINE: NOT DETECTED
Opiates: NOT DETECTED
Tetrahydrocannabinol: POSITIVE — AB

## 2014-04-19 LAB — URINALYSIS, ROUTINE W REFLEX MICROSCOPIC
BILIRUBIN URINE: NEGATIVE
Glucose, UA: NEGATIVE mg/dL
Hgb urine dipstick: NEGATIVE
Ketones, ur: NEGATIVE mg/dL
Leukocytes, UA: NEGATIVE
Nitrite: NEGATIVE
PH: 6 (ref 5.0–8.0)
PROTEIN: NEGATIVE mg/dL
Specific Gravity, Urine: 1.01 (ref 1.005–1.030)
Urobilinogen, UA: 0.2 mg/dL (ref 0.0–1.0)

## 2014-04-19 MED ORDER — FLEET ENEMA 7-19 GM/118ML RE ENEM
1.0000 | ENEMA | RECTAL | Status: AC
  Administered 2014-04-19: 1 via RECTAL

## 2014-04-19 MED ORDER — MORPHINE SULFATE 4 MG/ML IJ SOLN
4.0000 mg | INTRAMUSCULAR | Status: AC
  Administered 2014-04-19: 4 mg via INTRAVENOUS
  Filled 2014-04-19: qty 1

## 2014-04-19 MED ORDER — POLYETHYLENE GLYCOL 3350 17 G PO PACK: 17.0000 g | PACK | Freq: Every day | ORAL | Status: DC | PRN

## 2014-04-19 MED ORDER — LACTATED RINGERS IV BOLUS (SEPSIS)
1000.0000 mL | Freq: Once | INTRAVENOUS | Status: AC
  Administered 2014-04-19: 1000 mL via INTRAVENOUS

## 2014-04-19 MED ORDER — FLEET ENEMA 7-19 GM/118ML RE ENEM: 1.0000 | ENEMA | Freq: Once | RECTAL | Status: DC | PRN

## 2014-04-19 NOTE — MAU Note (Signed)
Pt states she passed out while trying to go to bathroom. Pt complaining of pain on her right side

## 2014-04-19 NOTE — Discharge Instructions (Signed)
Dizziness °Dizziness is a common problem. It is a feeling of unsteadiness or light-headedness. You may feel like you are about to faint. Dizziness can lead to injury if you stumble or fall. A person of any age group can suffer from dizziness, but dizziness is more common in older adults. °CAUSES  °Dizziness can be caused by many different things, including: °· Middle ear problems. °· Standing for too long. °· Infections. °· An allergic reaction. °· Aging. °· An emotional response to something, such as the sight of blood. °· Side effects of medicines. °· Tiredness. °· Problems with circulation or blood pressure. °· Excessive use of alcohol or medicines, or illegal drug use. °· Breathing too fast (hyperventilation). °· An irregular heart rhythm (arrhythmia). °· A low red blood cell count (anemia). °· Pregnancy. °· Vomiting, diarrhea, fever, or other illnesses that cause body fluid loss (dehydration). °· Diseases or conditions such as Parkinson's disease, high blood pressure (hypertension), diabetes, and thyroid problems. °· Exposure to extreme heat. °DIAGNOSIS  °Your health care provider will ask about your symptoms, perform a physical exam, and perform an electrocardiogram (ECG) to record the electrical activity of your heart. Your health care provider may also perform other heart or blood tests to determine the cause of your dizziness. These may include: °· Transthoracic echocardiogram (TTE). During echocardiography, sound waves are used to evaluate how blood flows through your heart. °· Transesophageal echocardiogram (TEE). °· Cardiac monitoring. This allows your health care provider to monitor your heart rate and rhythm in real time. °· Holter monitor. This is a portable device that records your heartbeat and can help diagnose heart arrhythmias. It allows your health care provider to track your heart activity for several days if needed. °· Stress tests by exercise or by giving medicine that makes the heart beat  faster. °TREATMENT  °Treatment of dizziness depends on the cause of your symptoms and can vary greatly. °HOME CARE INSTRUCTIONS  °· Drink enough fluids to keep your urine clear or pale yellow. This is especially important in very hot weather. In older adults, it is also important in cold weather. °· Take your medicine exactly as directed if your dizziness is caused by medicines. When taking blood pressure medicines, it is especially important to get up slowly. °¨ Rise slowly from chairs and steady yourself until you feel okay. °¨ In the morning, first sit up on the side of the bed. When you feel okay, stand slowly while holding onto something until you know your balance is fine. °· Move your legs often if you need to stand in one place for a long time. Tighten and relax your muscles in your legs while standing. °· Have someone stay with you for 1-2 days if dizziness continues to be a problem. Do this until you feel you are well enough to stay alone. Have the person call your health care provider if he or she notices changes in you that are concerning. °· Do not drive or use heavy machinery if you feel dizzy. °· Do not drink alcohol. °SEEK IMMEDIATE MEDICAL CARE IF:  °· Your dizziness or light-headedness gets worse. °· You feel nauseous or vomit. °· You have problems talking, walking, or using your arms, hands, or legs. °· You feel weak. °· You are not thinking clearly or you have trouble forming sentences. It may take a friend or family member to notice this. °· You have chest pain, abdominal pain, shortness of breath, or sweating. °· Your vision changes. °· You notice   any bleeding.  You have side effects from medicine that seems to be getting worse rather than better. MAKE SURE YOU:   Understand these instructions.  Will watch your condition.  Will get help right away if you are not doing well or get worse. Document Released: 12/01/2000 Document Revised: 06/12/2013 Document Reviewed: 12/25/2010 [facility]  Patient Information 2015 [place], [place]. This information is not intended to replace advice given to you by your health care provider. Make sure you discuss any questions you have with your health care provider.   Abdominal Pain During Pregnancy Abdominal pain is common in pregnancy. Most of the time, it does not cause harm. There are many causes of abdominal pain. Some causes are more serious than others. Some of the causes of abdominal pain in pregnancy are easily diagnosed. Occasionally, the diagnosis takes time to understand. Other times, the cause is not determined. Abdominal pain can be a sign that something is very wrong with the pregnancy, or the pain may have nothing to do with the pregnancy at all. For this reason, always tell your health care provider if you have any abdominal discomfort. HOME CARE INSTRUCTIONS  Monitor your abdominal pain for any changes. The following actions may help to alleviate any discomfort you are experiencing:  Do not have sexual intercourse or put anything in your vagina until your symptoms go away completely.  Get plenty of rest until your pain improves.  Drink clear fluids if you feel nauseous. Avoid solid food as long as you are uncomfortable or nauseous.  Only take over-the-counter or prescription medicine as directed by your health care provider.  Keep all follow-up appointments with your health care provider. SEEK IMMEDIATE MEDICAL CARE IF:  You are bleeding, leaking fluid, or passing tissue from the vagina.  You have increasing pain or cramping.  You have persistent vomiting.  You have painful or bloody urination.  You have a fever.  You notice a decrease in your baby's movements.  You have extreme weakness or feel faint.  You have shortness of breath, with or without abdominal pain.  You develop a severe headache with abdominal pain.  You have abnormal vaginal discharge with abdominal pain.  You have persistent diarrhea.  You have  abdominal pain that continues even after rest, or gets worse. MAKE SURE YOU:   Understand these instructions.  Will watch your condition.  Will get help right away if you are not doing well or get worse. Document Released: 06/07/2005 Document Revised: 03/28/2013 Document Reviewed: 01/04/2013 [facility] Patient Information 2015 [place], [place]. This information is not intended to replace advice given to you by your health care provider. Make sure you discuss any questions you have with your health care provider. High-Fiber Diet Fiber is found in fruits, vegetables, and grains. A high-fiber diet encourages the addition of more whole grains, legumes, fruits, and vegetables in your diet. The recommended amount of fiber for adult males is 38 g per day. For adult females, it is 25 g per day. Pregnant and lactating women should get 28 g of fiber per day. If you have a digestive or bowel problem, ask your caregiver for advice before adding high-fiber foods to your diet. Eat a variety of high-fiber foods instead of only a select few type of foods.  PURPOSE  To increase stool bulk.  To make bowel movements more regular to prevent constipation.  To lower cholesterol.  To prevent overeating. WHEN IS THIS DIET USED?  It may be used if you have constipation  and hemorrhoids.  It may be used if you have uncomplicated diverticulosis (intestine condition) and irritable bowel syndrome.  It may be used if you need help with weight management.  It may be used if you want to add it to your diet as a protective measure against atherosclerosis, diabetes, and cancer. SOURCES OF FIBER  Whole-grain breads and cereals.  Fruits, such as apples, oranges, bananas, berries, prunes, and pears.  Vegetables, such as green peas, carrots, sweet potatoes, beets, broccoli, cabbage, spinach, and artichokes.  Legumes, such split peas, soy, lentils.  Almonds. FIBER CONTENT IN FOODS Starches and Grains / Dietary Fiber  (g)  Raisin Bran, 1 cup / 7g  Cheerios, 1 cup / 3 g  Corn Flakes cereal, 1 cup / 0.7 g  Rice crispy treat cereal, 1 cup / 0.3 g  Instant oatmeal (cooked),  cup / 2 g  Frosted wheat cereal, 1 cup / 5.1 g  Brown, long-grain rice (cooked), 1 cup / 3.5 g  White, long-grain rice (cooked), 1 cup / 0.6 g  Enriched macaroni (cooked), 1 cup / 2.5 g Legumes / Dietary Fiber (g)  Baked beans (canned, plain, or vegetarian),  cup / 5.2 g  Kidney beans (canned),  cup / 6.8 g  Pinto beans (cooked),  cup / 5.5 g Breads and Crackers / Dietary Fiber (g)  Plain or honey graham crackers, 2 squares / 0.7 g  Saltine crackers, 3 squares / 0.3 g  Plain, salted pretzels, 10 pieces / 1.8 g  Whole-wheat bread, 1 slice / 1.9 g  White bread, 1 slice / 0.7 g  Raisin bread, 1 slice / 1.2 g  Plain bagel, 3 oz / 2 g  Flour tortilla, 1 oz / 0.9 g  Corn tortilla, 1 small / 1.5 g  Hamburger or hotdog bun, 1 small / 0.9 g Fruits / Dietary Fiber (g)  Apple with skin, 1 medium / 4.4 g  Sweetened applesauce,  cup / 1.5 g  Banana,  medium / 1.5 g  Grapes, 10 grapes / 0.4 g  Orange, 1 small / 2.3 g  Raisin, 1.5 oz / 1.6 g  Melon, 1 cup / 1.4 g Vegetables / Dietary Fiber (g)  Green beans (canned),  cup / 1.3 g  Carrots (cooked),  cup / 2.3 g  Broccoli (cooked),  cup / 2.8 g  Peas (cooked),  cup / 4.4 g  Mashed potatoes,  cup / 1.6 g  Lettuce, 1 cup / 0.5 g  Corn (canned),  cup / 1.6 g  Tomato,  cup / 1.1 g Document Released: 06/07/2005 Document Revised: 12/07/2011 Document Reviewed: 09/09/2011 [employer] Patient Information 2015 [place], [place]. This information is not intended to replace advice given to you by your health care provider. Make sure you discuss any questions you have with your health care provider.

## 2014-04-19 NOTE — MAU Note (Signed)
Pt given enema with good results

## 2014-04-19 NOTE — MAU Provider Note (Signed)
Chief Complaint: Abdominal Pain   First Provider Initiated Contact with Patient 04/19/14 0028     SUBJECTIVE HPI: Hannah Gilmore is a 38 y.o. [id number] at [redacted]w[redacted]d by LMP who presents to maternity admissions reporting her husband says she got up out of bed and then just collapsed to the floor.  She reports since then, she has had pain in her right side and right lower abdomen.  She does report off and on dizziness since her MAU visit on 10/22.  Labs were done at that time and hgb was 13.2. She is a 1.5 pack/day smoker.  Nicotine patch was prescribed on 10/22.  She also reports constipation, with last bowel movement 5 days ago.  She denies LOF, vaginal bleeding, vaginal itching/burning, urinary symptoms, h/a, vomiting, or fever/chills.    Outpatient ultrasound ordered on 10/22 but not yet scheduled with pt.   Past Medical History  Diagnosis Date  . Placenta previa     previous 2 pregnancy  . Medical history non-contributory    Past Surgical History  Procedure Laterality Date  . Appendectomy    . Cholecystectomy    . Tonsillectomy    . Abdominal surgery    . Cesarean section      x2   History   Social History  . Marital Status: Single    Spouse Name: N/A    Number of Children: N/A  . Years of Education: N/A   Occupational History  . Not on file.   Social History Main Topics  . Smoking status: Current Every Day Smoker -- 1.00 packs/day    Types: Cigarettes  . Smokeless tobacco: Not on file  . Alcohol Use: No  . Drug Use: No  . Sexual Activity: Not on file   Other Topics Concern  . Not on file   Social History Narrative  . No narrative on file   No current facility-administered medications on file prior to encounter.   Current Outpatient Prescriptions on File Prior to Encounter  Medication Sig Dispense Refill  . nicotine (NICODERM CQ - DOSED IN MG/24 HOURS) 21 mg/24hr patch Place 1 patch (21 mg total) onto the skin daily.  28 patch  5  . Prenatal Vit-Fe Fumarate-FA  (PRENATAL MULTIVITAMIN) TABS tablet Take 1 tablet by mouth daily at 12 noon.      . promethazine (PHENERGAN) 25 MG tablet Take 0.5-1 tablets (12.5-25 mg total) by mouth every 6 (six) hours as needed for nausea or vomiting.  30 tablet  0   Allergies  Allergen Reactions  . Levofloxacin Hives and Rash  . Penicillins Hives and Rash    ROS: Pertinent items in HPI  OBJECTIVE Blood pressure 134/69, pulse 85, temperature 97.8 F (36.6 C), temperature source Oral, last menstrual period 01/15/2014. GENERAL: Well-developed, well-nourished female in no acute distress.  HEENT: Normocephalic HEART: normal rate RESP: normal effort ABDOMEN: Soft, tenderness of right flank with mild guarding, no tenderness of abdomen noted EXTREMITIES: Nontender, no edema NEURO: Alert and oriented  FHT 145 by doppler   LAB RESULTS Results for orders placed during the hospital encounter of 04/19/14 (from the past 24 hour(s))  URINE RAPID DRUG SCREEN (HOSP PERFORMED)     Status: Abnormal   Collection Time    04/19/14  1:15 AM      Result Value Ref Range   Opiates NONE DETECTED  NONE DETECTED   Cocaine NONE DETECTED  NONE DETECTED   Benzodiazepines NONE DETECTED  NONE DETECTED   Amphetamines NONE DETECTED  NONE  DETECTED   Tetrahydrocannabinol POSITIVE (*) NONE DETECTED   Barbiturates NONE DETECTED  NONE DETECTED  URINALYSIS, ROUTINE W REFLEX MICROSCOPIC     Status: None   Collection Time    04/19/14  1:15 AM      Result Value Ref Range   Color, Urine YELLOW  YELLOW   APPearance CLEAR  CLEAR   Specific Gravity, Urine 1.010  1.005 - 1.030   pH 6.0  5.0 - 8.0   Glucose, UA NEGATIVE  NEGATIVE mg/dL   Hgb urine dipstick NEGATIVE  NEGATIVE   Bilirubin Urine NEGATIVE  NEGATIVE   Ketones, ur NEGATIVE  NEGATIVE mg/dL   Protein, ur NEGATIVE  NEGATIVE mg/dL   Urobilinogen, UA 0.2  0.0 - 1.0 mg/dL   Nitrite NEGATIVE  NEGATIVE   Leukocytes, UA NEGATIVE  NEGATIVE    ASSESSMENT 1. Constipation during pregnancy in  second trimester   2. Musculoskeletal pain   3. Dizziness     PLAN LR x 1000 ml, Morphine 4 mg IV, and Fleet enema in MAU Discharge home Pt given contact information to call ultrasound for scheduling Message to [facility] to schedule new OB appt Miralax and Colace daily PRN, Fleet enema weekly PRN   Follow-up Information   Follow up with [facility]. (Call the number below to schedule your ultrasound.)    Specialty:  Radiology   Contact information:   [address] [id number] [place] [address] [phone]      Follow up with [facility]. (The clinic will call you with appointment or call the number below.)    Specialty:  Obstetrics and Gynecology   Contact information:   [address] [place] [address] [phone]      Follow up with [facility]. (As needed for emergencies)    Contact information:   [address] [id number] [place] [address] [phone]      Fatima Blank Certified Nurse-Midwife 04/19/2014  2:59 AM

## 2014-04-19 NOTE — MAU Provider Note (Signed)
Attestation of Attending Supervision of Advanced Practitioner (CNM/NP): Evaluation and management procedures were performed by the Advanced Practitioner under my supervision and collaboration.  I have reviewed the Advanced Practitioner's note and chart, and I agree with the management and plan.  Hannah Gilmore, Hannah Gilmore 6:40 AM

## 2014-04-22 ENCOUNTER — Encounter (HOSPITAL_COMMUNITY): Payer: Self-pay | Admitting: *Deleted

## 2014-04-23 ENCOUNTER — Encounter: Payer: Self-pay | Admitting: Family Medicine

## 2014-04-23 ENCOUNTER — Ambulatory Visit (INDEPENDENT_AMBULATORY_CARE_PROVIDER_SITE_OTHER): Payer: Self-pay | Admitting: Family Medicine

## 2014-04-23 VITALS — BP 109/58 | HR 81 | Temp 98.4°F | Wt 133.1 lb

## 2014-04-23 DIAGNOSIS — Z8541 Personal history of malignant neoplasm of cervix uteri: Secondary | ICD-10-CM

## 2014-04-23 DIAGNOSIS — Z124 Encounter for screening for malignant neoplasm of cervix: Secondary | ICD-10-CM

## 2014-04-23 DIAGNOSIS — O2230 Deep phlebothrombosis in pregnancy, unspecified trimester: Secondary | ICD-10-CM | POA: Insufficient documentation

## 2014-04-23 DIAGNOSIS — O09219 Supervision of pregnancy with history of pre-term labor, unspecified trimester: Secondary | ICD-10-CM | POA: Insufficient documentation

## 2014-04-23 DIAGNOSIS — Z8759 Personal history of other complications of pregnancy, childbirth and the puerperium: Secondary | ICD-10-CM

## 2014-04-23 DIAGNOSIS — O09211 Supervision of pregnancy with history of pre-term labor, first trimester: Secondary | ICD-10-CM

## 2014-04-23 DIAGNOSIS — Z98891 History of uterine scar from previous surgery: Secondary | ICD-10-CM

## 2014-04-23 DIAGNOSIS — Z1151 Encounter for screening for human papillomavirus (HPV): Secondary | ICD-10-CM

## 2014-04-23 LAB — POCT URINALYSIS DIP (DEVICE)
Bilirubin Urine: NEGATIVE
Glucose, UA: NEGATIVE mg/dL
Hgb urine dipstick: NEGATIVE
KETONES UR: NEGATIVE mg/dL
Leukocytes, UA: NEGATIVE
Nitrite: NEGATIVE
PH: 6 (ref 5.0–8.0)
Protein, ur: NEGATIVE mg/dL
SPECIFIC GRAVITY, URINE: 1.02 (ref 1.005–1.030)
UROBILINOGEN UA: 0.2 mg/dL (ref 0.0–1.0)

## 2014-04-23 MED ORDER — ENOXAPARIN SODIUM 40 MG/0.4ML ~~LOC~~ SOLN: 40.0000 mg | SUBCUTANEOUS | Status: DC

## 2014-04-23 NOTE — Patient Instructions (Signed)
Second Trimester of Pregnancy The second trimester is from week 13 through week 28, month 4 through 6. This is often the time in pregnancy that you feel your best. Often times, morning sickness has lessened or quit. You may have more energy, and you may get hungry more often. Your unborn baby (fetus) is growing rapidly. At the end of the sixth month, he or she is about 9 inches long and weighs about 1 pounds. You will likely feel the baby move (quickening) between 18 and 20 weeks of pregnancy. HOME CARE   Avoid all smoking, herbs, and alcohol. Avoid drugs not approved by your doctor.  Only take medicine as told by your doctor. Some medicines are safe and some are not during pregnancy.  Exercise only as told by your doctor. Stop exercising if you start having cramps.  Eat regular, healthy meals.  Wear a good support bra if your breasts are tender.  Do not use hot tubs, steam rooms, or saunas.  Wear your seat belt when driving.  Avoid raw meat, uncooked cheese, and liter boxes and soil used by cats.  Take your prenatal vitamins.  Try taking medicine that helps you poop (stool softener) as needed, and if your doctor approves. Eat more fiber by eating fresh fruit, vegetables, and whole grains. Drink enough fluids to keep your pee (urine) clear or pale yellow.  Take warm water baths (sitz baths) to soothe pain or discomfort caused by hemorrhoids. Use hemorrhoid cream if your doctor approves.  If you have puffy, bulging veins (varicose veins), wear support hose. Raise (elevate) your feet for 15 minutes, 3-4 times a day. Limit salt in your diet.  Avoid heavy lifting, wear low heals, and sit up straight.  Rest with your legs raised if you have leg cramps or low back pain.  Visit your dentist if you have not gone during your pregnancy. Use a soft toothbrush to brush your teeth. Be gentle when you floss.  You can have sex (intercourse) unless your doctor tells you not to.  Go to your  doctor visits. GET HELP IF:   You feel dizzy.  You have mild cramps or pressure in your lower belly (abdomen).  You have a nagging pain in your belly area.  You continue to feel sick to your stomach (nauseous), throw up (vomit), or have watery poop (diarrhea).  You have bad smelling fluid coming from your vagina.  You have pain with peeing (urination). GET HELP RIGHT AWAY IF:   You have a fever.  You are leaking fluid from your vagina.  You have spotting or bleeding from your vagina.  You have severe belly cramping or pain.  You lose or gain weight rapidly.  You have trouble catching your breath and have chest pain.  You notice sudden or extreme puffiness (swelling) of your face, hands, ankles, feet, or legs.  You have not felt the baby move in over an hour.  You have severe headaches that do not go away with medicine.  You have vision changes. Document Released: 09/01/2009 Document Revised: 10/02/2012 Document Reviewed: 08/08/2012 [facility] Patient Information 2015 [place], [place]. This information is not intended to replace advice given to you by your health care provider. Make sure you discuss any questions you have with your health care provider.   Hydroxyprogesterone solution for injection What is this medicine? HYDROXYPROGESTERONE (hye drox ee proe JES ter one) is a female hormone. This medicine is used in women who are pregnant and who have delivered a  baby too early (preterm) in the past. It helps lower the risk of having a preterm baby again. This medicine may be used for other purposes; ask your health care provider or pharmacist if you have questions. COMMON BRAND NAME(S): Makena What should I tell my health care provider before I take this medicine? They need to know if you have any of these conditions: -blood clotting disorders -breast, cervical, uterine, or vaginal cancer -depression -diabetes or prediabetes -heart disease -high blood pressure -kidney  disease -liver disease -lung or breathing disease, like asthma -migraine headaches -seizures -vaginal bleeding -an unusual or allergic reaction to hydroxyprogesterone, other hormones, medicines, foods, dyes, castor oil, benzyl alcohol, or other preservatives -breast-feeding How should I use this medicine? This medicine is for injection into a muscle. It is given by a health care professional in a hospital or clinic setting. You are likely to get an injection once a week to prevent preterm delivery. Talk to your pediatrician regarding the use of this medicine in children. Special care may be needed. Overdosage: If you think you've taken too much of this medicine contact a poison control center or emergency room at once. Overdosage: If you think you have taken too much of this medicine contact a poison control center or emergency room at once. NOTE: This medicine is only for you. Do not share this medicine with others. What if I miss a dose? It is important not to miss your dose. Call your doctor or health care professional if you are unable to keep an appointment. What may interact with this medicine? -acetaminophen -bupropion -clozapine -efavirenz -halothane -methadone -nicotine -theophylline, aminophylline -tizanidine This list may not describe all possible interactions. Give your health care provider a list of all the medicines, herbs, non-prescription drugs, or dietary supplements you use. Also tell them if you smoke, drink alcohol, or use illegal drugs. Some items may interact with your medicine. What should I watch for while using this medicine? Your condition will be monitored carefully while you are receiving this medicine. What side effects may I notice from receiving this medicine? Side effects that you should report to your doctor or health care professional as soon as possible: -allergic reactions like skin rash, itching or hives, swelling of the face, lips, or  tongue -breathing problems -breast tissue changes or discharge -changes in vision -confusion, trouble speaking or understanding -depressed mood -increased hunger or thirst -increased urination -pain, redness, or irritation at site where injected -pain, swelling, warmth in the leg -shortness of breath, chest pain, swelling in a leg -sudden numbness or weakness of the face, arm or leg -sudden severe headaches -trouble walking, dizziness, loss of balance or coordination -unusually weak or tired -vaginal bleeding -yellowing of the eyes or skin Side effects that usually do not require medical attention (report to your doctor or health care professional if they continue or are bothersome): -changes in emotions or moods -diarrhea -fluid retention and swelling -nausea This list may not describe all possible side effects. Call your doctor for medical advice about side effects. You may report side effects to FDA at 1-800-FDA-1088. Where should I keep my medicine? This drug is given in a hospital or clinic and will not be stored at home. NOTE: This sheet is a summary. It may not cover all possible information. If you have questions about this medicine, talk to your doctor, pharmacist, or health care provider.  2015, [employer]. (2009-07-29 11:17:12)

## 2014-04-23 NOTE — Progress Notes (Signed)
Initial labs and pap today.  Reports all 7 pregnancies were preterm and the last 4 she had "placenta previa and needed lovenox injections."  New OB packet given.   17P RX form filled out and faxed to [facility].  Medicaid Enrollment card with numbers to [place] given for patient to call and get enrolled.

## 2014-04-23 NOTE — Progress Notes (Signed)
Subjective:    Hannah Gilmore is a [id number] [redacted]w[redacted]d being seen today for her first obstetrical visit.  Her obstetrical history is significant for advanced maternal age, history of preterm labor x 7, DVT during her 6th pregnancy and was on lovenox for that pregnancy and her last pregnancy.  She has a history of placenta previa with the last two pregnancies and delivered via c-section.  She was on 17-OH progesterone during her last pregnancy.  Additionally, she reports a history of cervical cancer and had significant resection due to not allowing her physician to preform a hysterectomy.  Her doctor at the time was in [place].  Patient does intend to breast feed. Pregnancy history fully reviewed.  Patient reports nausea, no bleeding, no contractions, no cramping and no leaking.  Filed Vitals:   04/23/14 1349  BP: 109/58  Pulse: 81  Temp: 98.4 F (36.9 C)  Weight: 133 lb 1.6 oz (60.374 kg)    HISTORY: OB History  Gravida Para Term Preterm AB SAB TAB Ectopic Multiple Living  9 7 0 7 1 1    7     # Outcome Date GA Lbr Len/2nd Weight Sex Delivery Anes PTL Lv  9 Current           8 Preterm 03/21/12 [redacted]w[redacted]d    CS-Unspec     7 Preterm 07/30/07 [redacted]w[redacted]d    CS-Unspec     6 Preterm           5 Preterm  [redacted]w[redacted]d         4 Preterm  [redacted]w[redacted]d         3 Preterm  [redacted]w[redacted]d  4 lb (1.814 kg)       2 Preterm  [redacted]w[redacted]d         1 SAB              Past Medical History  Diagnosis Date  . Placenta previa     previous 2 pregnancy  . Medical history non-contributory   . Vaginal Pap smear, abnormal   . Cervical cancer    Past Surgical History  Procedure Laterality Date  . Appendectomy    . Cholecystectomy    . Tonsillectomy    . Abdominal surgery    . Cesarean section      x2  . Cervical cone biopsy     History reviewed. No pertinent family history.   Exam    Uterus:     Pelvic Exam:    Perineum: No Hemorrhoids, Normal Perineum   Vulva: normal, Bartholin's, Urethra, Skene's normal   Vagina:  normal  mucosa, normal discharge   Cervix: no bleeding following Pap and no cervical motion tenderness  System:     Skin: normal coloration and turgor, no rashes    Neurologic: gait normal; reflexes normal and symmetric   Extremities: normal strength, tone, and muscle mass   HEENT PERRLA and extra ocular movement intact   Mouth/Teeth mucous membranes moist, pharynx normal without lesions   Neck supple and no masses   Cardiovascular: regular rate and rhythm, no murmurs or gallops   Respiratory:  appears well, vitals normal, no respiratory distress, acyanotic, normal RR, ear and throat exam is normal, neck free of mass or lymphadenopathy, chest clear, no wheezing, crepitations, rhonchi, normal symmetric air entry   Abdomen: soft, non-tender; bowel sounds normal; no masses,  no organomegaly   Urinary: urethral meatus normal      Assessment:    Pregnancy: [id number] There are no active problems  to display for this patient.       Plan:     Initial labs drawn. Prenatal vitamins. Problem list reviewed and updated. Genetic Screening discussed will ask about quad screen next appt.  Ultrasound discussed; fetal survey: requested.  Schedule for 4 weeks Would like BTL. Start Makena at 16 weeks Start lovenox.  Follow up in 4 weeks. 100% of 45 min visit spent on counseling and coordination of care.    Loma Boston JEHIEL 04/23/2014

## 2014-04-23 NOTE — Addendum Note (Signed)
Addended by: Truett Mainland on: 04/23/2014 03:25 PM   Modules accepted: Orders

## 2014-04-24 ENCOUNTER — Telehealth: Payer: Self-pay

## 2014-04-24 LAB — ABO AND RH: Rh Type: POSITIVE

## 2014-04-24 LAB — CYSTIC FIBROSIS DIAGNOSTIC STUDY

## 2014-04-24 LAB — RUBELLA SCREEN: Rubella: 1.18 Index — ABNORMAL HIGH (ref <0.90)

## 2014-04-24 LAB — CULTURE, OB URINE
Colony Count: NO GROWTH
Organism ID, Bacteria: NO GROWTH

## 2014-04-24 LAB — HEPATITIS B SURFACE ANTIGEN: Hepatitis B Surface Ag: NEGATIVE

## 2014-04-24 LAB — HEPATITIS C ANTIBODY: HCV AB: NEGATIVE

## 2014-04-24 NOTE — Telephone Encounter (Signed)
Received a call from [facility] with [facility] She states as patient is currently self pay, she was approved for the grant program and 17P will be sent to office when patient is close to 16 weeks. States that she also spoke to patient and told patient that [place] will need to be notified once she obtains insurance so that they can run an insurance check. She states the clinic or patient will be able to do this. Patient in the process of enrolling for Medicaid.

## 2014-04-25 LAB — CYTOLOGY - PAP

## 2014-05-08 ENCOUNTER — Telehealth: Payer: Self-pay

## 2014-05-08 NOTE — Telephone Encounter (Signed)
Called pt and left message to return the call to the office so that we set up an appt.   Re: Pt's 17p came in today, 05/08/14, and pt is a new start of 17p.  Pt has an appt scheduled on Tuesday 05/21/14 but needs to start.

## 2014-05-08 NOTE — Telephone Encounter (Signed)
Pt returned call to the front desk and scheduled first 17p injection on 05/13/14 @ 1515.

## 2014-05-13 ENCOUNTER — Ambulatory Visit (INDEPENDENT_AMBULATORY_CARE_PROVIDER_SITE_OTHER): Payer: Self-pay | Admitting: *Deleted

## 2014-05-13 VITALS — BP 116/54 | HR 83 | Temp 98.0°F

## 2014-05-13 DIAGNOSIS — O09212 Supervision of pregnancy with history of pre-term labor, second trimester: Secondary | ICD-10-CM

## 2014-05-13 DIAGNOSIS — O09892 Supervision of other high risk pregnancies, second trimester: Secondary | ICD-10-CM

## 2014-05-13 MED ORDER — HYDROXYPROGESTERONE CAPROATE 250 MG/ML IM OIL
250.0000 mg | TOPICAL_OIL | INTRAMUSCULAR | Status: AC
  Administered 2014-05-13 – 2014-09-10 (×17): 250 mg via INTRAMUSCULAR

## 2014-05-13 NOTE — Progress Notes (Signed)
17p injections initiated

## 2014-05-21 ENCOUNTER — Other Ambulatory Visit: Payer: Self-pay | Admitting: Family Medicine

## 2014-05-21 ENCOUNTER — Ambulatory Visit (HOSPITAL_COMMUNITY)
Admission: RE | Admit: 2014-05-21 | Discharge: 2014-05-21 | Disposition: A | Discharge status: Home / Self Care | Payer: Medicaid Other | Source: Ambulatory Visit | Attending: Family Medicine | Admitting: Family Medicine

## 2014-05-21 ENCOUNTER — Ambulatory Visit (INDEPENDENT_AMBULATORY_CARE_PROVIDER_SITE_OTHER): Payer: Self-pay | Admitting: Family Medicine

## 2014-05-21 VITALS — BP 102/59 | HR 81 | Temp 97.7°F | Wt 140.3 lb

## 2014-05-21 DIAGNOSIS — Z8541 Personal history of malignant neoplasm of cervix uteri: Secondary | ICD-10-CM

## 2014-05-21 DIAGNOSIS — Z98891 History of uterine scar from previous surgery: Secondary | ICD-10-CM

## 2014-05-21 DIAGNOSIS — O3421 Maternal care for scar from previous cesarean delivery: Secondary | ICD-10-CM | POA: Insufficient documentation

## 2014-05-21 DIAGNOSIS — O09212 Supervision of pregnancy with history of pre-term labor, second trimester: Secondary | ICD-10-CM | POA: Diagnosis not present

## 2014-05-21 DIAGNOSIS — O0932 Supervision of pregnancy with insufficient antenatal care, second trimester: Secondary | ICD-10-CM | POA: Insufficient documentation

## 2014-05-21 DIAGNOSIS — Z1389 Encounter for screening for other disorder: Secondary | ICD-10-CM | POA: Insufficient documentation

## 2014-05-21 DIAGNOSIS — O09211 Supervision of pregnancy with history of pre-term labor, first trimester: Secondary | ICD-10-CM

## 2014-05-21 DIAGNOSIS — O30042 Twin pregnancy, dichorionic/diamniotic, second trimester: Secondary | ICD-10-CM | POA: Diagnosis not present

## 2014-05-21 DIAGNOSIS — O2230 Deep phlebothrombosis in pregnancy, unspecified trimester: Secondary | ICD-10-CM

## 2014-05-21 DIAGNOSIS — Z3A18 18 weeks gestation of pregnancy: Secondary | ICD-10-CM | POA: Diagnosis not present

## 2014-05-21 DIAGNOSIS — O09522 Supervision of elderly multigravida, second trimester: Secondary | ICD-10-CM | POA: Diagnosis not present

## 2014-05-21 DIAGNOSIS — Z8759 Personal history of other complications of pregnancy, childbirth and the puerperium: Secondary | ICD-10-CM

## 2014-05-21 DIAGNOSIS — O09529 Supervision of elderly multigravida, unspecified trimester: Secondary | ICD-10-CM | POA: Insufficient documentation

## 2014-05-21 LAB — POCT URINALYSIS DIP (DEVICE)
Bilirubin Urine: NEGATIVE
Glucose, UA: NEGATIVE mg/dL
HGB URINE DIPSTICK: NEGATIVE
Ketones, ur: NEGATIVE mg/dL
Leukocytes, UA: NEGATIVE
NITRITE: NEGATIVE
PH: 7.5 (ref 5.0–8.0)
Protein, ur: NEGATIVE mg/dL
Specific Gravity, Urine: 1.015 (ref 1.005–1.030)
UROBILINOGEN UA: 0.2 mg/dL (ref 0.0–1.0)

## 2014-05-21 MED ORDER — PROMETHAZINE HCL 25 MG PO TABS: 12.5000 mg | ORAL_TABLET | Freq: Four times a day (QID) | ORAL | Status: DC | PRN

## 2014-05-21 MED ORDER — HYDROXYZINE PAMOATE 25 MG PO CAPS: 25.0000 mg | ORAL_CAPSULE | Freq: Three times a day (TID) | ORAL | Status: DC | PRN

## 2014-05-21 NOTE — Progress Notes (Signed)
Pt is having more pressure than normal the past day.

## 2014-05-21 NOTE — Patient Instructions (Signed)
Second Trimester of Pregnancy The second trimester is from week 13 through week 28, months 4 through 6. The second trimester is often a time when you feel your best. Your body has also adjusted to being pregnant, and you begin to feel better physically. Usually, morning sickness has lessened or quit completely, you may have more energy, and you may have an increase in appetite. The second trimester is also a time when the fetus is growing rapidly. At the end of the sixth month, the fetus is about 9 inches long and weighs about 1 pounds. You will likely begin to feel the baby move (quickening) between 18 and 20 weeks of the pregnancy. BODY CHANGES Your body goes through many changes during pregnancy. The changes vary from woman to woman.   Your weight will continue to increase. You will notice your lower abdomen bulging out.  You may begin to get stretch marks on your hips, abdomen, and breasts.  You may develop headaches that can be relieved by medicines approved by your health care provider.  You may urinate more often because the fetus is pressing on your bladder.  You may develop or continue to have heartburn as a result of your pregnancy.  You may develop constipation because certain hormones are causing the muscles that push waste through your intestines to slow down.  You may develop hemorrhoids or swollen, bulging veins (varicose veins).  You may have back pain because of the weight gain and pregnancy hormones relaxing your joints between the bones in your pelvis and as a result of a shift in weight and the muscles that support your balance.  Your breasts will continue to grow and be tender.  Your gums may bleed and may be sensitive to brushing and flossing.  Dark spots or blotches (chloasma, mask of pregnancy) may develop on your face. This will likely fade after the baby is born.  A dark line from your belly button to the pubic area (linea nigra) may appear. This will likely fade  after the baby is born.  You may have changes in your hair. These can include thickening of your hair, rapid growth, and changes in texture. Some women also have hair loss during or after pregnancy, or hair that feels dry or thin. Your hair will most likely return to normal after your baby is born. WHAT TO EXPECT AT YOUR PRENATAL VISITS During a routine prenatal visit:  You will be weighed to make sure you and the fetus are growing normally.  Your blood pressure will be taken.  Your abdomen will be measured to track your baby's growth.  The fetal heartbeat will be listened to.  Any test results from the previous visit will be discussed. Your health care provider may ask you:  How you are feeling.  If you are feeling the baby move.  If you have had any abnormal symptoms, such as leaking fluid, bleeding, severe headaches, or abdominal cramping.  If you have any questions. Other tests that may be performed during your second trimester include:  Blood tests that check for:  Low iron levels (anemia).  Gestational diabetes (between 24 and 28 weeks).  Rh antibodies.  Urine tests to check for infections, diabetes, or protein in the urine.  An ultrasound to confirm the proper growth and development of the baby.  An amniocentesis to check for possible genetic problems.  Fetal screens for spina bifida and Down syndrome. HOME CARE INSTRUCTIONS   Avoid all smoking, herbs, alcohol, and unprescribed   drugs. These chemicals affect the formation and growth of the baby.  Follow your health care provider's instructions regarding medicine use. There are medicines that are either safe or unsafe to take during pregnancy.  Exercise only as directed by your health care provider. Experiencing uterine cramps is a good sign to stop exercising.  Continue to eat regular, healthy meals.  Wear a good support bra for breast tenderness.  Do not use hot tubs, steam rooms, or saunas.  Wear your  seat belt at all times when driving.  Avoid raw meat, uncooked cheese, cat litter boxes, and soil used by cats. These carry germs that can cause birth defects in the baby.  Take your prenatal vitamins.  Try taking a stool softener (if your health care provider approves) if you develop constipation. Eat more high-fiber foods, such as fresh vegetables or fruit and whole grains. Drink plenty of fluids to keep your urine clear or pale yellow.  Take warm sitz baths to soothe any pain or discomfort caused by hemorrhoids. Use hemorrhoid cream if your health care provider approves.  If you develop varicose veins, wear support hose. Elevate your feet for 15 minutes, 3-4 times a day. Limit salt in your diet.  Avoid heavy lifting, wear low heel shoes, and practice good posture.  Rest with your legs elevated if you have leg cramps or low back pain.  Visit your dentist if you have not gone yet during your pregnancy. Use a soft toothbrush to brush your teeth and be gentle when you floss.  A sexual relationship may be continued unless your health care provider directs you otherwise.  Continue to go to all your prenatal visits as directed by your health care provider. SEEK MEDICAL CARE IF:   You have dizziness.  You have mild pelvic cramps, pelvic pressure, or nagging pain in the abdominal area.  You have persistent nausea, vomiting, or diarrhea.  You have a bad smelling vaginal discharge.  You have pain with urination. SEEK IMMEDIATE MEDICAL CARE IF:   You have a fever.  You are leaking fluid from your vagina.  You have spotting or bleeding from your vagina.  You have severe abdominal cramping or pain.  You have rapid weight gain or loss.  You have shortness of breath with chest pain.  You notice sudden or extreme swelling of your face, hands, ankles, feet, or legs.  You have not felt your baby move in over an hour.  You have severe headaches that do not go away with  medicine.  You have vision changes. Document Released: 06/01/2001 Document Revised: 06/12/2013 Document Reviewed: 08/08/2012 Exit[employer] Patient Information 2015 [employer]. This information is not intended to replace advice given to you by your health care provider. Make sure you discuss any questions you have with your health care provider.  

## 2014-05-21 NOTE — Progress Notes (Signed)
Has not gotten medicaid - hasn't picked up lovenox.  Discussed importance of starting lovenox as soon as she gets medicaid.  [place] later today.  Some vaginal pressure - [place] should show cervix.  Starting to feel the baby move.  17-P today. F/u in 4 weeks

## 2014-05-24 ENCOUNTER — Telehealth: Payer: Self-pay | Admitting: *Deleted

## 2014-05-24 DIAGNOSIS — O2230 Deep phlebothrombosis in pregnancy, unspecified trimester: Secondary | ICD-10-CM

## 2014-05-24 MED ORDER — PRENATAL VITAMINS 0.8 MG PO TABS: 1.0000 | ORAL_TABLET | Freq: Every day | ORAL | Status: DC

## 2014-05-24 NOTE — Telephone Encounter (Signed)
Giulietta left a message that she went to get her meds last night and some had been sent back.Wants Dr. Nehemiah Settle or someone to call her.  Called Amiylah and she states she had problems with medicaid- but now it is approved and she went to get her meds. States that [facility] said didn't have her lovenox, nicoderm or prenatal vitamins. I informed her per chart we sent lovenox prescription in, printed nicoderm and had not sent in for prenatal vitamins. Yet.  I informed her I would send in an order for prenatal vitamins, and I would call regarding the lovenox and nicoderm.   Called [facility] in [place] and pharmacist verified they had the order for the lovenox, she ran it thru [place] while i was on the phone and she states medicaid did not approve it- it would require preauthorization and she would fax [place] the  preauth information with numbers to call. Also we reviewed the order for the nicoderm and it looks like it was discontinued- will verifiy with provider first. Caryn Section and informed her prenatal vitamins would be ready later today, would like to call medicaid and get lovenox approved and that would be next week- also that I need to confirm with provider that nicoderm patches ok.

## 2014-05-26 ENCOUNTER — Other Ambulatory Visit: Payer: Self-pay | Admitting: Family Medicine

## 2014-05-26 MED ORDER — NICOTINE 21 MG/24HR TD PT24: 21.0000 mg | MEDICATED_PATCH | Freq: Every day | TRANSDERMAL | Status: DC

## 2014-05-27 NOTE — Telephone Encounter (Signed)
Called Medicaid to get pre-authorization for lovenox.  They stated pharmacy ran it as generic lovenox and name brand lovenox is preferred , so if pharmacy runs it as namebrand will be approved. Called [facility] and notified pharmacy, they will rerun it as name brand and contact patient when it is ready.   Per Dr. Nehemiah Settle nicoderm patches have been represcribed by him.  Called Steph[facility] and unable to leave message- heard message number is incorrect. Patient has appointment tomorrow for 17P - can tell her them, but should be notified by pharmacy

## 2014-05-28 ENCOUNTER — Ambulatory Visit (INDEPENDENT_AMBULATORY_CARE_PROVIDER_SITE_OTHER): Payer: Medicaid Other

## 2014-05-28 VITALS — BP 115/71 | HR 84 | Temp 98.1°F | Wt 136.7 lb

## 2014-05-28 DIAGNOSIS — O09212 Supervision of pregnancy with history of pre-term labor, second trimester: Secondary | ICD-10-CM

## 2014-05-28 NOTE — Progress Notes (Signed)
Patient here today for weekly injection of 17P. 44ml 17P administered into RUO quadrant of buttocks. Patient tolerated well. No questions, concerns, or problems to report. Patient informed that lovenox and nicoderm at pharmacy.To return in 1 week for next injection.

## 2014-06-04 ENCOUNTER — Encounter: Payer: Self-pay | Admitting: *Deleted

## 2014-06-04 ENCOUNTER — Inpatient Hospital Stay (HOSPITAL_COMMUNITY): Payer: Medicaid Other

## 2014-06-04 ENCOUNTER — Ambulatory Visit (INDEPENDENT_AMBULATORY_CARE_PROVIDER_SITE_OTHER): Payer: Medicaid Other | Admitting: *Deleted

## 2014-06-04 ENCOUNTER — Inpatient Hospital Stay (HOSPITAL_COMMUNITY)
Admission: AD | Admit: 2014-06-04 | Discharge: 2014-06-04 | Disposition: A | Discharge status: Home / Self Care | Payer: Medicaid Other | Source: Ambulatory Visit | Attending: Obstetrics & Gynecology | Admitting: Obstetrics & Gynecology

## 2014-06-04 ENCOUNTER — Encounter (HOSPITAL_COMMUNITY): Payer: Self-pay | Admitting: *Deleted

## 2014-06-04 VITALS — BP 114/59 | HR 93 | Wt 138.7 lb

## 2014-06-04 DIAGNOSIS — N898 Other specified noninflammatory disorders of vagina: Secondary | ICD-10-CM

## 2014-06-04 DIAGNOSIS — O42919 Preterm premature rupture of membranes, unspecified as to length of time between rupture and onset of labor, unspecified trimester: Secondary | ICD-10-CM

## 2014-06-04 DIAGNOSIS — Z3A20 20 weeks gestation of pregnancy: Secondary | ICD-10-CM | POA: Diagnosis not present

## 2014-06-04 DIAGNOSIS — O4290 Premature rupture of membranes, unspecified as to length of time between rupture and onset of labor, unspecified weeks of gestation: Secondary | ICD-10-CM | POA: Insufficient documentation

## 2014-06-04 DIAGNOSIS — O09212 Supervision of pregnancy with history of pre-term labor, second trimester: Secondary | ICD-10-CM

## 2014-06-04 DIAGNOSIS — O30042 Twin pregnancy, dichorionic/diamniotic, second trimester: Secondary | ICD-10-CM | POA: Diagnosis not present

## 2014-06-04 DIAGNOSIS — O99332 Smoking (tobacco) complicating pregnancy, second trimester: Secondary | ICD-10-CM | POA: Diagnosis not present

## 2014-06-04 DIAGNOSIS — O3421 Maternal care for scar from previous cesarean delivery: Secondary | ICD-10-CM | POA: Diagnosis not present

## 2014-06-04 DIAGNOSIS — Z8541 Personal history of malignant neoplasm of cervix uteri: Secondary | ICD-10-CM | POA: Diagnosis not present

## 2014-06-04 DIAGNOSIS — O9989 Other specified diseases and conditions complicating pregnancy, childbirth and the puerperium: Secondary | ICD-10-CM | POA: Insufficient documentation

## 2014-06-04 DIAGNOSIS — Z7189 Other specified counseling: Secondary | ICD-10-CM

## 2014-06-04 DIAGNOSIS — F17210 Nicotine dependence, cigarettes, uncomplicated: Secondary | ICD-10-CM | POA: Diagnosis not present

## 2014-06-04 LAB — URINALYSIS, ROUTINE W REFLEX MICROSCOPIC
Bilirubin Urine: NEGATIVE
Glucose, UA: NEGATIVE mg/dL
Hgb urine dipstick: NEGATIVE
Ketones, ur: NEGATIVE mg/dL
Nitrite: NEGATIVE
PH: 6 (ref 5.0–8.0)
Protein, ur: NEGATIVE mg/dL
Specific Gravity, Urine: 1.025 (ref 1.005–1.030)
Urobilinogen, UA: 0.2 mg/dL (ref 0.0–1.0)

## 2014-06-04 LAB — URINE MICROSCOPIC-ADD ON

## 2014-06-04 LAB — WET PREP, GENITAL
TRICH WET PREP: NONE SEEN
WBC WET PREP: NONE SEEN
Yeast Wet Prep HPF POC: NONE SEEN

## 2014-06-04 LAB — AMNISURE RUPTURE OF MEMBRANE (ROM) NOT AT ARMC: Amnisure ROM: NEGATIVE

## 2014-06-04 LAB — POCT FERN TEST: POCT Fern Test: NEGATIVE

## 2014-06-04 MED ORDER — CYCLOBENZAPRINE HCL 5 MG PO TABS: 5.0000 mg | ORAL_TABLET | Freq: Three times a day (TID) | ORAL | Status: DC | PRN

## 2014-06-04 NOTE — Progress Notes (Signed)
Pt complains of pressure and large amount of clear fluid for the past 2 days.  Consulted with Dr. Elly Modena, pt needs to go to MAU for evaluation. Injection given.  Pt transported to MAU. Pt states Nicoderm is not covered by Medicaid, phone call to Jenny Reichmann (Education officer, [employer]) to handle this situation.  Makena re ordered.

## 2014-06-04 NOTE — Progress Notes (Signed)
Dr Deniece Ree in earlier. Written and verbal d/c instructions given and understanding voiced.

## 2014-06-04 NOTE — MAU Note (Signed)
Leaking fld for couple days. More today. Was seen in clinic today for 17P shot and then sent up to MAU for further of leaking and pelvic pressure

## 2014-06-04 NOTE — Progress Notes (Signed)
Toco only on since only [redacted]w[redacted]d. Unaware of ctxs. Just feels pressure that comes and goes

## 2014-06-04 NOTE — Progress Notes (Signed)
New Refill ordered on 06/04/14.

## 2014-06-04 NOTE — Progress Notes (Signed)
Dr Grandville Silos notified of pt's admission and status. Aware of hx of leaking for couple days -more this am. Twin gest. All deliveries between 30-34wks. Aware of uterine activity. No pain but pelvic pressure. Will see pt

## 2014-06-04 NOTE — MAU Provider Note (Signed)
History     CSN: [id number]  Arrival date and time: 06/04/14 1403   None     Chief Complaint  Patient presents with  . Leaking fluid    HPI Patient is 38 y.o. [id number] [redacted]w[redacted]d with di-di twins here with complaints of leaking last night, worse this morning, "smelled like amniotic fluid", was clear. soaked though underwear.  Last intercourse was a few days ago.  Denies contractions, feels some tightening and a lot of pressure similar to when she was previously in labor.  +FM, denies VB, contractions, vaginal discharge.  Denies hx of STD.  Hx of 5 previous vaginal deliveries, last 2 deliveries via cesarean section 2/2 placenta previa.  All deliveries were preterm 30w to 34w.  Past Medical History  Diagnosis Date  . Placenta previa     previous 2 pregnancy  . Medical history non-contributory   . Vaginal Pap smear, abnormal   . Cervical cancer     Past Surgical History  Procedure Laterality Date  . Appendectomy    . Cholecystectomy    . Tonsillectomy    . Abdominal surgery    . Cesarean section      x2  . Cervical cone biopsy      Family History  Problem Relation Age of Onset  . Alcohol abuse Neg Hx     History  Substance Use Topics  . Smoking status: Current Every Day Smoker -- 1.00 packs/day    Types: Cigarettes  . Smokeless tobacco: Never Used  . Alcohol Use: No    Allergies:  Allergies  Allergen Reactions  . Levofloxacin Hives and Rash  . Penicillins Hives and Rash    Facility-administered medications prior to admission  Medication Dose Route Frequency Provider Last Rate Last Dose  . hydroxyprogesterone caproate (DELALUTIN) 250 mg/mL injection 250 mg  250 mg Intramuscular Weekly Peggy Constant, MD   250 mg at 06/04/14 1418   Prescriptions prior to admission  Medication Sig Dispense Refill Last Dose  . acetaminophen (TYLENOL) 500 MG tablet Take 500 mg by mouth every 6 (six) hours as needed for headache.   Past Week at Unknown time  . docusate sodium  (COLACE) 100 MG capsule Take 100 mg by mouth daily as needed for mild constipation.    Past Week at Unknown time  . enoxaparin (LOVENOX) 40 MG/0.4ML injection Inject 0.4 mLs (40 mg total) into the skin daily. 30 Syringe 6 06/03/2014 at 1500  . hydrOXYzine (VISTARIL) 25 MG capsule Take 1 capsule (25 mg total) by mouth 3 (three) times daily as needed. (Patient taking differently: Take 25 mg by mouth 2 (two) times daily as needed for anxiety. ) 30 capsule 1 06/04/2014 at Unknown time  . Prenatal Multivit-Min-Fe-FA (PRENATAL VITAMINS) 0.8 MG tablet Take 1 tablet by mouth daily. 30 tablet 12 06/03/2014 at Unknown time  . promethazine (PHENERGAN) 25 MG tablet Take 0.5-1 tablets (12.5-25 mg total) by mouth every 6 (six) hours as needed for nausea or vomiting. 30 tablet 0 06/04/2014 at Unknown time  . pyridoxine (B-6) 100 MG tablet Take 100 mg by mouth daily.   06/04/2014 at Unknown time  . nicotine (NICODERM CQ - DOSED IN MG/24 HOURS) 21 mg/24hr patch Place 1 patch (21 mg total) onto the skin daily. (Patient not taking: Reported on 06/04/2014) 28 patch 5     Review of Systems  Constitutional: Negative for fever and chills.  Respiratory: Positive for shortness of breath (baseline). Negative for cough.   Cardiovascular: Positive for leg swelling.  Gastrointestinal: Positive for nausea. Negative for heartburn, vomiting and diarrhea.  Genitourinary: Positive for frequency (baseline). Negative for dysuria, urgency and hematuria.  Neurological: Positive for headaches (none currently).   Physical Exam   Blood pressure 113/70, pulse 92, temperature 98.2 F (36.8 C), resp. rate 18, last menstrual period 01/15/2014.  Physical Exam  Constitutional: She is oriented to person, place, and time. She appears well-developed and well-nourished.  HENT:  Head: Normocephalic and atraumatic.  Eyes: Conjunctivae and EOM are normal.  Neck: Normal range of motion.  Cardiovascular: Normal rate.   Respiratory: Effort  normal. No respiratory distress.  GI: Soft. She exhibits no distension. There is no tenderness.  Musculoskeletal: Normal range of motion. She exhibits no edema.  Neurological: She is alert and oriented to person, place, and time.  Skin: Skin is warm and dry. No erythema.    MAU Course  Procedures  MDM   Labs: Results for orders placed or performed during the hospital encounter of 06/04/14 (from the past 24 hour(s))  Urinalysis, Routine w reflex microscopic   Collection Time: 06/04/14  2:10 PM  Result Value Ref Range   Color, Urine YELLOW YELLOW   APPearance CLEAR CLEAR   Specific Gravity, Urine 1.025 1.005 - 1.030   pH 6.0 5.0 - 8.0   Glucose, UA NEGATIVE NEGATIVE mg/dL   Hgb urine dipstick NEGATIVE NEGATIVE   Bilirubin Urine NEGATIVE NEGATIVE   Ketones, ur NEGATIVE NEGATIVE mg/dL   Protein, ur NEGATIVE NEGATIVE mg/dL   Urobilinogen, UA 0.2 0.0 - 1.0 mg/dL   Nitrite NEGATIVE NEGATIVE   Leukocytes, UA SMALL (A) NEGATIVE  Urine microscopic-add on   Collection Time: 06/04/14  2:10 PM  Result Value Ref Range   Squamous Epithelial / LPF FEW (A) RARE   WBC, UA 3-6 <3 WBC/hpf  Wet prep, genital   Collection Time: 06/04/14  3:25 PM  Result Value Ref Range   Yeast Wet Prep HPF POC NONE SEEN NONE SEEN   Trich, Wet Prep NONE SEEN NONE SEEN   Clue Cells Wet Prep HPF POC FEW (A) NONE SEEN   WBC, Wet Prep HPF POC NONE SEEN NONE SEEN  Fern Test   Collection Time: 06/04/14  3:40 PM  Result Value Ref Range   POCT Fern Test Negative = intact amniotic membranes   Amnisure rupture of membrane (rom)   Collection Time: 06/04/14  4:05 PM  Result Value Ref Range   Amnisure ROM NEGATIVE     Imaging Studies:  US Ob Detail + 14 Wk  05/21/2014   OBSTETRICAL ULTRASOUND: This exam was performed within a [facility]. The OB US report was generated in the AS system, and faxed to the ordering physician.   This report is available in the [facility]. See the AS Obstetric US  report via the Image Link.  US Ob Detail Addl Gest + 14 Wk  05/21/2014   OBSTETRICAL ULTRASOUND: This exam was performed within a [facility]. The OB US report was generated in the AS system, and faxed to the ordering physician.   This report is available in the [facility]. See the AS Obstetric US report via the Image Link.  [place] Mfm Ob Transvaginal  05/21/2014   OBSTETRICAL ULTRASOUND: This exam was performed within a  Ultrasound Department. The OB US report was generated in the AS system, and faxed to the ordering physician.   This report is available in the [facility]. See the AS Obstetric US report via  the Image Link.     Assessment and Plan  Patient is 38 y.o. [phone] [redacted]w[redacted]d here with complaints of ruptured membranes likely 2/2 leukorrhea of pregnancy despite moderately convincing pooling on exam - fern neg, amnisure neg, normal fluid on sono today - at this time all tests point to no rupture of membranes, on exam patient did have some pooling which was very concerning for rupture of membranes.  Discussed labs/results with patient, if occurs again, symptoms worsen, any contractions she is to return to clinic/MAU for reevaluation.   Hannah Gilmore 06/04/2014, 3:11 PM

## 2014-06-04 NOTE — Discharge Instructions (Signed)
° ° ° °  Preterm Labor Information Preterm labor is when labor starts before you are [redacted] weeks pregnant. The normal length of pregnancy is 39 to 41 weeks.  CAUSES  The cause of preterm labor is not often known. The most common known cause is infection. RISK FACTORS  Having a history of preterm labor.  Having your water break before it should.  Having a placenta that covers the opening of the cervix.  Having a placenta that breaks away from the uterus.  Having a cervix that is too weak to hold the baby in the uterus.  Having too much fluid in the amniotic sac.  Taking drugs or smoking while pregnant.  Not gaining enough weight while pregnant.  Being younger than 74 and older than 37 years old.  Having a low income.  Being African American. SYMPTOMS  Period-like cramps, belly (abdominal) pain, or back pain.  Contractions that are regular, as often as six in an hour. They may be mild or painful.  Contractions that start at the top of the belly. They then move to the lower belly and back.  Lower belly pressure that seems to get stronger.  Bleeding from the vagina.  Fluid leaking from the vagina. TREATMENT  Treatment depends on:  Your condition.  The condition of your baby.  How many weeks pregnant you are. Your doctor may have you:  Take medicine to stop contractions.  Stay in bed except to use the restroom (bed rest).  Stay in the hospital. WHAT SHOULD YOU DO IF YOU THINK YOU ARE IN PRETERM LABOR? Call your doctor right away. You need to go to the hospital right away.  HOW CAN YOU PREVENT PRETERM LABOR IN FUTURE PREGNANCIES?  Stop smoking, if you smoke.  Maintain healthy weight gain.  Do not take drugs or be around chemicals that are not needed.  Tell your doctor if you think you have an infection.  Tell your doctor if you had a preterm labor before. Document Released: 09/03/2008 Document Revised: 03/28/2013 Document Reviewed: 09/03/2008 [facility]  Patient Information 2015 [place], [place]. This information is not intended to replace advice given to you by your health care provider. Make sure you discuss any questions you have with your health care provider.

## 2014-06-05 ENCOUNTER — Telehealth: Payer: Self-pay | Admitting: *Deleted

## 2014-06-05 LAB — GC/CHLAMYDIA PROBE AMP
CT PROBE, AMP APTIMA: NEGATIVE
GC PROBE AMP APTIMA: NEGATIVE

## 2014-06-05 NOTE — Telephone Encounter (Signed)
Pt left message requesting to speak to Dr. Nehemiah Settle.  She said she is still leaking fluid in the morning time.  She states she "got checked yesterday and was told lots of stuff". I returned her call and discussed her concern. She stated that she had some pelvic pressure and another gush of clear fluid this morning which smelled like amniotic fluid. She has not had any additional fluid leaking since that time. I advised pt that if she has another episode of fluid leaking, she needs to go to [facility] for evaluation as soon as possible because it is easier to make a diagnosis soon after the event. Pt voiced understanding.

## 2014-06-11 ENCOUNTER — Ambulatory Visit (INDEPENDENT_AMBULATORY_CARE_PROVIDER_SITE_OTHER): Payer: Medicaid Other

## 2014-06-11 VITALS — BP 112/52 | HR 82 | Temp 97.6°F | Wt 142.5 lb

## 2014-06-11 DIAGNOSIS — O09212 Supervision of pregnancy with history of pre-term labor, second trimester: Secondary | ICD-10-CM

## 2014-06-11 NOTE — Progress Notes (Signed)
Patient here today for weekly 17P. 58ml 17P administered into RUO quadrant of buttocks. Patient tolerated well. No questions concerns, or problems to report. To return in 1 week for next injection.

## 2014-06-17 ENCOUNTER — Ambulatory Visit: Payer: Medicaid Other

## 2014-06-17 ENCOUNTER — Encounter: Payer: Self-pay | Admitting: Family Medicine

## 2014-06-17 ENCOUNTER — Ambulatory Visit (INDEPENDENT_AMBULATORY_CARE_PROVIDER_SITE_OTHER): Payer: Medicaid Other | Admitting: Family Medicine

## 2014-06-17 VITALS — BP 116/80 | HR 86 | Ht 64.0 in | Wt 139.3 lb

## 2014-06-17 DIAGNOSIS — Z23 Encounter for immunization: Secondary | ICD-10-CM

## 2014-06-17 DIAGNOSIS — O09212 Supervision of pregnancy with history of pre-term labor, second trimester: Secondary | ICD-10-CM

## 2014-06-17 DIAGNOSIS — O99330 Smoking (tobacco) complicating pregnancy, unspecified trimester: Secondary | ICD-10-CM | POA: Insufficient documentation

## 2014-06-17 DIAGNOSIS — R0789 Other chest pain: Secondary | ICD-10-CM

## 2014-06-17 DIAGNOSIS — O99332 Smoking (tobacco) complicating pregnancy, second trimester: Secondary | ICD-10-CM

## 2014-06-17 DIAGNOSIS — O30042 Twin pregnancy, dichorionic/diamniotic, second trimester: Secondary | ICD-10-CM

## 2014-06-17 LAB — POCT URINALYSIS DIP (DEVICE)
Bilirubin Urine: NEGATIVE
Glucose, UA: NEGATIVE mg/dL
Hgb urine dipstick: NEGATIVE
Ketones, ur: NEGATIVE mg/dL
Nitrite: NEGATIVE
Protein, ur: NEGATIVE mg/dL
SPECIFIC GRAVITY, URINE: 1.01 (ref 1.005–1.030)
UROBILINOGEN UA: 0.2 mg/dL (ref 0.0–1.0)
pH: 6.5 (ref 5.0–8.0)

## 2014-06-17 MED ORDER — OMEPRAZOLE MAGNESIUM 20 MG PO TBEC: 20.0000 mg | DELAYED_RELEASE_TABLET | Freq: Every day | ORAL | Status: DC

## 2014-06-17 MED ORDER — NICOTINE 7 MG/24HR TD PT24: 21.0000 mg | MEDICATED_PATCH | Freq: Every day | TRANSDERMAL | Status: DC

## 2014-06-17 NOTE — Progress Notes (Signed)
Continue 17P U/s for growth Nutrition today Abdominal pain with food--try prilosec Flu shot today Reports patch is making her sick--will decrease dose.

## 2014-06-17 NOTE — Progress Notes (Signed)
Growth U/S 06/20/14 @ 3p with Radiology.

## 2014-06-17 NOTE — Progress Notes (Signed)
Nutrition note: consult for nutr re: twins Pt is pregnant with twins. Pt has gained 19.4# @ [redacted]w[redacted]d, which is wnl. Pt reports eating 3 meals & 3 snacks/d. Pt reports some nausea & heartburn. Pt is taking a PNV. Pt received verbal & written education on nutrition during pregnancy with twins. Discussed tips to decrease nausea & heartburn. Discussed wt gain goals of 37-54# or 1.4#/wk. Pt agrees to include a protein source with each meal & snack & continue taking her PNV. Pt does not have [place] but plans to apply. Pt plans to BF. F/u in 4-6 wks Vladimir Faster, MS, RD, LDN, [facility]

## 2014-06-17 NOTE — Progress Notes (Signed)
Pt reports some chest pain, reports she had this issue with her last pregnancy.  Pt having twins, one boy and one girl.  Pt not using nicotine patches, states they take her appetite away.

## 2014-06-17 NOTE — Patient Instructions (Signed)
Second Trimester of Pregnancy The second trimester is from week 13 through week 28, months 4 through 6. The second trimester is often a time when you feel your best. Your body has also adjusted to being pregnant, and you begin to feel better physically. Usually, morning sickness has lessened or quit completely, you may have more energy, and you may have an increase in appetite. The second trimester is also a time when the fetus is growing rapidly. At the end of the sixth month, the fetus is about 9 inches long and weighs about 1 pounds. You will likely begin to feel the baby move (quickening) between 18 and 20 weeks of the pregnancy. BODY CHANGES Your body goes through many changes during pregnancy. The changes vary from woman to woman.   Your weight will continue to increase. You will notice your lower abdomen bulging out.  You may begin to get stretch marks on your hips, abdomen, and breasts.  You may develop headaches that can be relieved by medicines approved by your health care provider.  You may urinate more often because the fetus is pressing on your bladder.  You may develop or continue to have heartburn as a result of your pregnancy.  You may develop constipation because certain hormones are causing the muscles that push waste through your intestines to slow down.  You may develop hemorrhoids or swollen, bulging veins (varicose veins).  You may have back pain because of the weight gain and pregnancy hormones relaxing your joints between the bones in your pelvis and as a result of a shift in weight and the muscles that support your balance.  Your breasts will continue to grow and be tender.  Your gums may bleed and may be sensitive to brushing and flossing.  Dark spots or blotches (chloasma, mask of pregnancy) may develop on your face. This will likely fade after the baby is born.  A dark line from your belly button to the pubic area (linea nigra) may appear. This will likely  fade after the baby is born.  You may have changes in your hair. These can include thickening of your hair, rapid growth, and changes in texture. Some women also have hair loss during or after pregnancy, or hair that feels dry or thin. Your hair will most likely return to normal after your baby is born. WHAT TO EXPECT AT YOUR PRENATAL VISITS During a routine prenatal visit:  You will be weighed to make sure you and the fetus are growing normally.  Your blood pressure will be taken.  Your abdomen will be measured to track your baby's growth.  The fetal heartbeat will be listened to.  Any test results from the previous visit will be discussed. Your health care provider may ask you:  How you are feeling.  If you are feeling the baby move.  If you have had any abnormal symptoms, such as leaking fluid, bleeding, severe headaches, or abdominal cramping.  If you have any questions. Other tests that may be performed during your second trimester include:  Blood tests that check for:  Low iron levels (anemia).  Gestational diabetes (between 24 and 28 weeks).  Rh antibodies.  Urine tests to check for infections, diabetes, or protein in the urine.  An ultrasound to confirm the proper growth and development of the baby.  An amniocentesis to check for possible genetic problems.  Fetal screens for spina bifida and Down syndrome. HOME CARE INSTRUCTIONS   Avoid all smoking, herbs, alcohol, and unprescribed   drugs. These chemicals affect the formation and growth of the baby.  Follow your health care provider's instructions regarding medicine use. There are medicines that are either safe or unsafe to take during pregnancy.  Exercise only as directed by your health care provider. Experiencing uterine cramps is a good sign to stop exercising.  Continue to eat regular, healthy meals.  Wear a good support bra for breast tenderness.  Do not use hot tubs, steam rooms, or saunas.  Wear  your seat belt at all times when driving.  Avoid raw meat, uncooked cheese, cat litter boxes, and soil used by cats. These carry germs that can cause birth defects in the baby.  Take your prenatal vitamins.  Try taking a stool softener (if your health care provider approves) if you develop constipation. Eat more high-fiber foods, such as fresh vegetables or fruit and whole grains. Drink plenty of fluids to keep your urine clear or pale yellow.  Take warm sitz baths to soothe any pain or discomfort caused by hemorrhoids. Use hemorrhoid cream if your health care provider approves.  If you develop varicose veins, wear support hose. Elevate your feet for 15 minutes, 3-4 times a day. Limit salt in your diet.  Avoid heavy lifting, wear low heel shoes, and practice good posture.  Rest with your legs elevated if you have leg cramps or low back pain.  Visit your dentist if you have not gone yet during your pregnancy. Use a soft toothbrush to brush your teeth and be gentle when you floss.  A sexual relationship may be continued unless your health care provider directs you otherwise.  Continue to go to all your prenatal visits as directed by your health care provider. SEEK MEDICAL CARE IF:   You have dizziness.  You have mild pelvic cramps, pelvic pressure, or nagging pain in the abdominal area.  You have persistent nausea, vomiting, or diarrhea.  You have a bad smelling vaginal discharge.  You have pain with urination. SEEK IMMEDIATE MEDICAL CARE IF:   You have a fever.  You are leaking fluid from your vagina.  You have spotting or bleeding from your vagina.  You have severe abdominal cramping or pain.  You have rapid weight gain or loss.  You have shortness of breath with chest pain.  You notice sudden or extreme swelling of your face, hands, ankles, feet, or legs.  You have not felt your baby move in over an hour.  You have severe headaches that do not go away with  medicine.  You have vision changes. Document Released: 06/01/2001 Document Revised: 06/12/2013 Document Reviewed: 08/08/2012 Exit[employer] Patient Information 2015 [employer]. This information is not intended to replace advice given to you by your health care provider. Make sure you discuss any questions you have with your health care provider.  Breastfeeding Deciding to breastfeed is one of the best choices you can make for you and your baby. A change in hormones during pregnancy causes your breast tissue to grow and increases the number and size of your milk ducts. These hormones also allow proteins, sugars, and fats from your blood supply to make breast milk in your milk-producing glands. Hormones prevent breast milk from being released before your baby is born as well as prompt milk flow after birth. Once breastfeeding has begun, thoughts of your baby, as well as his or her sucking or crying, can stimulate the release of milk from your milk-producing glands.  BENEFITS OF BREASTFEEDING For Your Baby  Your first   milk (colostrum) helps your baby's digestive system function better.   There are antibodies in your milk that help your baby fight off infections.   Your baby has a lower incidence of asthma, allergies, and sudden infant death syndrome.   The nutrients in breast milk are better for your baby than infant formulas and are designed uniquely for your baby's needs.   Breast milk improves your baby's brain development.   Your baby is less likely to develop other conditions, such as childhood obesity, asthma, or type 2 diabetes mellitus.  For You   Breastfeeding helps to create a very special bond between you and your baby.   Breastfeeding is convenient. Breast milk is always available at the correct temperature and costs nothing.   Breastfeeding helps to burn calories and helps you lose the weight gained during pregnancy.   Breastfeeding makes your uterus contract to its  prepregnancy size faster and slows bleeding (lochia) after you give birth.   Breastfeeding helps to lower your risk of developing type 2 diabetes mellitus, osteoporosis, and breast or ovarian cancer later in life. SIGNS THAT YOUR BABY IS HUNGRY Early Signs of Hunger  Increased alertness or activity.  Stretching.  Movement of the head from side to side.  Movement of the head and opening of the mouth when the corner of the mouth or cheek is stroked (rooting).  Increased sucking sounds, smacking lips, cooing, sighing, or squeaking.  Hand-to-mouth movements.  Increased sucking of fingers or hands. Late Signs of Hunger  Fussing.  Intermittent crying. Extreme Signs of Hunger Signs of extreme hunger will require calming and consoling before your baby will be able to breastfeed successfully. Do not wait for the following signs of extreme hunger to occur before you initiate breastfeeding:   Restlessness.  A loud, strong cry.   Screaming. BREASTFEEDING BASICS Breastfeeding Initiation  Find a comfortable place to sit or lie down, with your neck and back well supported.  Place a pillow or rolled up blanket under your baby to bring him or her to the level of your breast (if you are seated). Nursing pillows are specially designed to help support your arms and your baby while you breastfeed.  Make sure that your baby's abdomen is facing your abdomen.   Gently massage your breast. With your fingertips, massage from your chest wall toward your nipple in a circular motion. This encourages milk flow. You may need to continue this action during the feeding if your milk flows slowly.  Support your breast with 4 fingers underneath and your thumb above your nipple. Make sure your fingers are well away from your nipple and your baby's mouth.   Stroke your baby's lips gently with your finger or nipple.   When your baby's mouth is open wide enough, quickly bring your baby to your breast,  placing your entire nipple and as much of the colored area around your nipple (areola) as possible into your baby's mouth.   More areola should be visible above your baby's upper lip than below the lower lip.   Your baby's tongue should be between his or her lower gum and your breast.   Ensure that your baby's mouth is correctly positioned around your nipple (latched). Your baby's lips should create a seal on your breast and be turned out (everted).  It is common for your baby to suck about 2-3 minutes in order to start the flow of breast milk. Latching Teaching your baby how to latch on to your breast   properly is very important. An improper latch can cause nipple pain and decreased milk supply for you and poor weight gain in your baby. Also, if your baby is not latched onto your nipple properly, he or she may swallow some air during feeding. This can make your baby fussy. Burping your baby when you switch breasts during the feeding can help to get rid of the air. However, teaching your baby to latch on properly is still the best way to prevent fussiness from swallowing air while breastfeeding. Signs that your baby has successfully latched on to your nipple:    Silent tugging or silent sucking, without causing you pain.   Swallowing heard between every 3-4 sucks.    Muscle movement above and in front of his or her ears while sucking.  Signs that your baby has not successfully latched on to nipple:   Sucking sounds or smacking sounds from your baby while breastfeeding.  Nipple pain. If you think your baby has not latched on correctly, slip your finger into the corner of your baby's mouth to break the suction and place it between your baby's gums. Attempt breastfeeding initiation again. Signs of Successful Breastfeeding Signs from your baby:   A gradual decrease in the number of sucks or complete cessation of sucking.   Falling asleep.   Relaxation of his or her body.    Retention of a small amount of milk in his or her mouth.   Letting go of your breast by himself or herself. Signs from you:  Breasts that have increased in firmness, weight, and size 1-3 hours after feeding.   Breasts that are softer immediately after breastfeeding.  Increased milk volume, as well as a change in milk consistency and color by the fifth day of breastfeeding.   Nipples that are not sore, cracked, or bleeding. Signs That Your Baby is Getting Enough Milk  Wetting at least 3 diapers in a 24-hour period. The urine should be clear and pale yellow by age 5 days.  At least 3 stools in a 24-hour period by age 5 days. The stool should be soft and yellow.  At least 3 stools in a 24-hour period by age 7 days. The stool should be seedy and yellow.  No loss of weight greater than 10% of birth weight during the first 3 days of age.  Average weight gain of 4-7 ounces (113-198 g) per week after age 4 days.  Consistent daily weight gain by age 5 days, without weight loss after the age of 2 weeks. After a feeding, your baby may spit up a small amount. This is common. BREASTFEEDING FREQUENCY AND DURATION Frequent feeding will help you make more milk and can prevent sore nipples and breast engorgement. Breastfeed when you feel the need to reduce the fullness of your breasts or when your baby shows signs of hunger. This is called "breastfeeding on demand." Avoid introducing a pacifier to your baby while you are working to establish breastfeeding (the first 4-6 weeks after your baby is born). After this time you may choose to use a pacifier. Research has shown that pacifier use during the first year of a baby's life decreases the risk of sudden infant death syndrome (SIDS). Allow your baby to feed on each breast as long as he or she wants. Breastfeed until your baby is finished feeding. When your baby unlatches or falls asleep while feeding from the first breast, offer the second breast.  Because newborns are often sleepy in the   first few weeks of life, you may need to awaken your baby to get him or her to feed. Breastfeeding times will vary from baby to baby. However, the following rules can serve as a guide to help you ensure that your baby is properly fed:  Newborns (babies 4 weeks of age or younger) may breastfeed every 1-3 hours.  Newborns should not go longer than 3 hours during the day or 5 hours during the night without breastfeeding.  You should breastfeed your baby a minimum of 8 times in a 24-hour period until you begin to introduce solid foods to your baby at around 6 months of age. BREAST MILK PUMPING Pumping and storing breast milk allows you to ensure that your baby is exclusively fed your breast milk, even at times when you are unable to breastfeed. This is especially important if you are going back to work while you are still breastfeeding or when you are not able to be present during feedings. Your lactation consultant can give you guidelines on how long it is safe to store breast milk.  A breast pump is a machine that allows you to pump milk from your breast into a sterile bottle. The pumped breast milk can then be stored in a refrigerator or freezer. Some breast pumps are operated by hand, while others use electricity. Ask your lactation consultant which type will work best for you. Breast pumps can be purchased, but some hospitals and breastfeeding support groups lease breast pumps on a monthly basis. A lactation consultant can teach you how to hand express breast milk, if you prefer not to use a pump.  CARING FOR YOUR BREASTS WHILE YOU BREASTFEED Nipples can become dry, cracked, and sore while breastfeeding. The following recommendations can help keep your breasts moisturized and healthy:  Avoid using soap on your nipples.   Wear a supportive bra. Although not required, special nursing bras and tank tops are designed to allow access to your breasts for  breastfeeding without taking off your entire bra or top. Avoid wearing underwire-style bras or extremely tight bras.  Air dry your nipples for 3-4minutes after each feeding.   Use only cotton bra pads to absorb leaked breast milk. Leaking of breast milk between feedings is normal.   Use lanolin on your nipples after breastfeeding. Lanolin helps to maintain your skin's normal moisture barrier. If you use pure lanolin, you do not need to wash it off before feeding your baby again. Pure lanolin is not toxic to your baby. You may also hand express a few drops of breast milk and gently massage that milk into your nipples and allow the milk to air dry. In the first few weeks after giving birth, some women experience extremely full breasts (engorgement). Engorgement can make your breasts feel heavy, warm, and tender to the touch. Engorgement peaks within 3-5 days after you give birth. The following recommendations can help ease engorgement:  Completely empty your breasts while breastfeeding or pumping. You may want to start by applying warm, moist heat (in the shower or with warm water-soaked hand towels) just before feeding or pumping. This increases circulation and helps the milk flow. If your baby does not completely empty your breasts while breastfeeding, pump any extra milk after he or she is finished.  Wear a snug bra (nursing or regular) or tank top for 1-2 days to signal your body to slightly decrease milk production.  Apply ice packs to your breasts, unless this is too uncomfortable for you.    Make sure that your baby is latched on and positioned properly while breastfeeding. If engorgement persists after 48 hours of following these recommendations, contact your health care provider or a lactation consultant. OVERALL HEALTH CARE RECOMMENDATIONS WHILE BREASTFEEDING  Eat healthy foods. Alternate between meals and snacks, eating 3 of each per day. Because what you eat affects your breast milk,  some of the foods may make your baby more irritable than usual. Avoid eating these foods if you are sure that they are negatively affecting your baby.  Drink milk, fruit juice, and water to satisfy your thirst (about 10 glasses a day).   Rest often, relax, and continue to take your prenatal vitamins to prevent fatigue, stress, and anemia.  Continue breast self-awareness checks.  Avoid chewing and smoking tobacco.  Avoid alcohol and drug use. Some medicines that may be harmful to your baby can pass through breast milk. It is important to ask your health care provider before taking any medicine, including all over-the-counter and prescription medicine as well as vitamin and herbal supplements. It is possible to become pregnant while breastfeeding. If birth control is desired, ask your health care provider about options that will be safe for your baby. SEEK MEDICAL CARE IF:   You feel like you want to stop breastfeeding or have become frustrated with breastfeeding.  You have painful breasts or nipples.  Your nipples are cracked or bleeding.  Your breasts are red, tender, or warm.  You have a swollen area on either breast.  You have a fever or chills.  You have nausea or vomiting.  You have drainage other than breast milk from your nipples.  Your breasts do not become full before feedings by the fifth day after you give birth.  You feel sad and depressed.  Your baby is too sleepy to eat well.  Your baby is having trouble sleeping.   Your baby is wetting less than 3 diapers in a 24-hour period.  Your baby has less than 3 stools in a 24-hour period.  Your baby's skin or the white part of his or her eyes becomes yellow.   Your baby is not gaining weight by 5 days of age. SEEK IMMEDIATE MEDICAL CARE IF:   Your baby is overly tired (lethargic) and does not want to wake up and feed.  Your baby develops an unexplained fever. Document Released: 06/07/2005 Document Revised:  06/12/2013 Document Reviewed: 11/29/2012 [employer] Patient Information 2015 [employer]. This information is not intended to replace advice given to you by your health care provider. Make sure you discuss any questions you have with your health care provider.  

## 2014-06-18 ENCOUNTER — Encounter: Payer: Self-pay | Admitting: Family Medicine

## 2014-06-20 ENCOUNTER — Ambulatory Visit (HOSPITAL_COMMUNITY): Payer: Medicaid Other

## 2014-06-24 ENCOUNTER — Ambulatory Visit (INDEPENDENT_AMBULATORY_CARE_PROVIDER_SITE_OTHER): Payer: Medicaid Other

## 2014-06-24 VITALS — BP 114/65 | HR 92 | Temp 97.8°F | Wt 142.1 lb

## 2014-06-24 DIAGNOSIS — O09212 Supervision of pregnancy with history of pre-term labor, second trimester: Secondary | ICD-10-CM

## 2014-06-24 NOTE — Progress Notes (Signed)
Patient here today for weekly injection of 17P. 17P administered intramuscularly into RUO quadrant of buttocks. Patient tolerated well. No questions, concerns or problems to report. Will see social worker Jenny Reichmann today and return in 1 week for next injection.

## 2014-06-25 ENCOUNTER — Ambulatory Visit (HOSPITAL_COMMUNITY): Admission: RE | Admit: 2014-06-25 | Payer: Medicaid Other | Source: Ambulatory Visit

## 2014-07-01 ENCOUNTER — Encounter: Payer: Self-pay | Admitting: Obstetrics and Gynecology

## 2014-07-01 ENCOUNTER — Other Ambulatory Visit: Payer: Self-pay | Admitting: Obstetrics and Gynecology

## 2014-07-01 ENCOUNTER — Ambulatory Visit (INDEPENDENT_AMBULATORY_CARE_PROVIDER_SITE_OTHER): Payer: Medicaid Other

## 2014-07-01 VITALS — Wt 144.0 lb

## 2014-07-01 DIAGNOSIS — R21 Rash and other nonspecific skin eruption: Secondary | ICD-10-CM | POA: Insufficient documentation

## 2014-07-01 DIAGNOSIS — O09212 Supervision of pregnancy with history of pre-term labor, second trimester: Secondary | ICD-10-CM

## 2014-07-01 MED ORDER — TRIAMCINOLONE ACETONIDE 0.025 % EX OINT: 1.0000 "application " | TOPICAL_OINTMENT | Freq: Two times a day (BID) | CUTANEOUS | Status: DC

## 2014-07-01 NOTE — Progress Notes (Signed)
Pt. Here today for weekly injection 17P. 75ml 17P administered into RUO quadrant of buttocks. Patient tolerated well. Reports rash under left breast, right side upper abdomen and on neck. Upon examination-- areas contain small red raised bumps in confined areas-- patient denies spreading or that any other family member is experiencing them. Patient tried cortisone cream for a week with no relief. Dr. Mancel Bale to evaluate.   Per Dr. Mancel Bale, believes it may be Pupps-- to try triamcinolone cream.

## 2014-07-01 NOTE — Progress Notes (Signed)
Asked to see patient while meeting with SW in clinic and pt with mild rash under breasts and on nape of neck. Rash erythematous, with superficial excoriations. No weeping, no vesicles. Likely dermatitis, advised triamcinalone as no improvement w/ hydrocortisone. If no improvement, to call clinic and consider trial of nystatin, although low likelihood of yeast. Likely PUPPS.

## 2014-07-02 ENCOUNTER — Ambulatory Visit (HOSPITAL_COMMUNITY)
Admission: RE | Admit: 2014-07-02 | Discharge: 2014-07-02 | Disposition: A | Discharge status: Home / Self Care | Payer: Medicaid Other | Source: Ambulatory Visit | Attending: Family Medicine | Admitting: Family Medicine

## 2014-07-02 DIAGNOSIS — O0932 Supervision of pregnancy with insufficient antenatal care, second trimester: Secondary | ICD-10-CM | POA: Diagnosis not present

## 2014-07-02 DIAGNOSIS — O30042 Twin pregnancy, dichorionic/diamniotic, second trimester: Secondary | ICD-10-CM | POA: Insufficient documentation

## 2014-07-02 DIAGNOSIS — O3421 Maternal care for scar from previous cesarean delivery: Secondary | ICD-10-CM | POA: Insufficient documentation

## 2014-07-02 DIAGNOSIS — O09212 Supervision of pregnancy with history of pre-term labor, second trimester: Secondary | ICD-10-CM | POA: Insufficient documentation

## 2014-07-02 DIAGNOSIS — Z3A24 24 weeks gestation of pregnancy: Secondary | ICD-10-CM | POA: Insufficient documentation

## 2014-07-02 DIAGNOSIS — Z86718 Personal history of other venous thrombosis and embolism: Secondary | ICD-10-CM | POA: Diagnosis not present

## 2014-07-08 ENCOUNTER — Encounter: Payer: Self-pay | Admitting: Obstetrics and Gynecology

## 2014-07-08 ENCOUNTER — Ambulatory Visit (INDEPENDENT_AMBULATORY_CARE_PROVIDER_SITE_OTHER): Payer: Medicaid Other | Admitting: Obstetrics and Gynecology

## 2014-07-08 VITALS — BP 101/50 | HR 83 | Wt 146.2 lb

## 2014-07-08 DIAGNOSIS — Z8541 Personal history of malignant neoplasm of cervix uteri: Secondary | ICD-10-CM

## 2014-07-08 DIAGNOSIS — O30042 Twin pregnancy, dichorionic/diamniotic, second trimester: Secondary | ICD-10-CM

## 2014-07-08 DIAGNOSIS — O09212 Supervision of pregnancy with history of pre-term labor, second trimester: Secondary | ICD-10-CM

## 2014-07-08 DIAGNOSIS — O99332 Smoking (tobacco) complicating pregnancy, second trimester: Secondary | ICD-10-CM

## 2014-07-08 DIAGNOSIS — O2230 Deep phlebothrombosis in pregnancy, unspecified trimester: Secondary | ICD-10-CM

## 2014-07-08 LAB — POCT URINALYSIS DIP (DEVICE)
BILIRUBIN URINE: NEGATIVE
Glucose, UA: NEGATIVE mg/dL
Hgb urine dipstick: NEGATIVE
KETONES UR: NEGATIVE mg/dL
Nitrite: NEGATIVE
PROTEIN: NEGATIVE mg/dL
Specific Gravity, Urine: 1.015 (ref 1.005–1.030)
UROBILINOGEN UA: 0.2 mg/dL (ref 0.0–1.0)
pH: 7 (ref 5.0–8.0)

## 2014-07-08 NOTE — Progress Notes (Signed)
Patient is doing well, reports pelvic pressure and occasional contractions. Cervix- closed, thick and ballotable. Continue weekly 17-P. BTL papers to be signed today. Will send urine culture Continue daily lovenox FM/PTL precautions reviewed

## 2014-07-08 NOTE — Progress Notes (Signed)
Here for originally just shot, but c/o increased pressure - has twins, also needs just come once a week due to transportation- comes Mondays for shot- so changed to ob visit and shot today

## 2014-07-10 LAB — CULTURE, OB URINE
COLONY COUNT: NO GROWTH
ORGANISM ID, BACTERIA: NO GROWTH

## 2014-07-11 ENCOUNTER — Encounter: Payer: Medicaid Other | Admitting: Obstetrics and Gynecology

## 2014-07-15 ENCOUNTER — Encounter: Payer: Self-pay | Admitting: *Deleted

## 2014-07-15 ENCOUNTER — Ambulatory Visit (INDEPENDENT_AMBULATORY_CARE_PROVIDER_SITE_OTHER): Payer: Medicaid Other

## 2014-07-15 VITALS — BP 116/57 | HR 98 | Temp 98.1°F | Wt 148.6 lb

## 2014-07-15 DIAGNOSIS — O09212 Supervision of pregnancy with history of pre-term labor, second trimester: Secondary | ICD-10-CM

## 2014-07-15 NOTE — Progress Notes (Signed)
BTL papers re-signed with name and middle initial as well as medicaid ID number. Copy given to patient and original placed in scan folder.

## 2014-07-15 NOTE — Progress Notes (Signed)
Patient here for weekly injection of 17P. 35ml 17P adminstered into LUO quadrant of buttocks. Patient tolerated well. Patient c/o of cramps in legs at night. Advised she ensure she is staying well hydrated. Informed her leg cramps can also be a sign of low potassium-- recommended eating a banana a day or yellow mustard. Patient verbalized understanding. No further questions or concerns.

## 2014-07-22 ENCOUNTER — Ambulatory Visit (INDEPENDENT_AMBULATORY_CARE_PROVIDER_SITE_OTHER): Payer: Self-pay | Admitting: Obstetrics & Gynecology

## 2014-07-22 VITALS — BP 106/59 | HR 85 | Temp 97.9°F | Wt 147.0 lb

## 2014-07-22 DIAGNOSIS — O2230 Deep phlebothrombosis in pregnancy, unspecified trimester: Secondary | ICD-10-CM

## 2014-07-22 DIAGNOSIS — O30042 Twin pregnancy, dichorionic/diamniotic, second trimester: Secondary | ICD-10-CM

## 2014-07-22 DIAGNOSIS — O30043 Twin pregnancy, dichorionic/diamniotic, third trimester: Secondary | ICD-10-CM

## 2014-07-22 DIAGNOSIS — Z23 Encounter for immunization: Secondary | ICD-10-CM

## 2014-07-22 DIAGNOSIS — O09212 Supervision of pregnancy with history of pre-term labor, second trimester: Secondary | ICD-10-CM

## 2014-07-22 LAB — US OB FOLLOW UP

## 2014-07-22 LAB — CBC
HCT: 31.9 % — ABNORMAL LOW (ref 36.0–46.0)
Hemoglobin: 11.2 g/dL — ABNORMAL LOW (ref 12.0–15.0)
MCH: 31.2 pg (ref 26.0–34.0)
MCHC: 35.1 g/dL (ref 30.0–36.0)
MCV: 88.9 fL (ref 78.0–100.0)
MPV: 10.3 fL (ref 8.6–12.4)
Platelets: 188 10*3/uL (ref 150–400)
RBC: 3.59 MIL/uL — AB (ref 3.87–5.11)
RDW: 13.4 % (ref 11.5–15.5)
WBC: 12.2 10*3/uL — ABNORMAL HIGH (ref 4.0–10.5)

## 2014-07-22 LAB — GLUCOSE TOLERANCE, 1 HOUR (50G) W/O FASTING: Glucose, 1 Hour GTT: 93 mg/dL (ref 70–140)

## 2014-07-22 LAB — POCT URINALYSIS DIP (DEVICE)
BILIRUBIN URINE: NEGATIVE
Glucose, UA: NEGATIVE mg/dL
Hgb urine dipstick: NEGATIVE
Ketones, ur: NEGATIVE mg/dL
Nitrite: NEGATIVE
PH: 7 (ref 5.0–8.0)
Protein, ur: NEGATIVE mg/dL
Specific Gravity, Urine: 1.01 (ref 1.005–1.030)
Urobilinogen, UA: 0.2 mg/dL (ref 0.0–1.0)

## 2014-07-22 LAB — RPR

## 2014-07-22 LAB — HIV ANTIBODY (ROUTINE TESTING W REFLEX): HIV 1&2 Ab, 4th Generation: NONREACTIVE

## 2014-07-22 MED ORDER — PROMETHAZINE HCL 25 MG PO TABS: 12.5000 mg | ORAL_TABLET | Freq: Four times a day (QID) | ORAL | Status: DC | PRN

## 2014-07-22 MED ORDER — TETANUS-DIPHTH-ACELL PERTUSSIS 5-2.5-18.5 LF-MCG/0.5 IM SUSP
0.5000 mL | Freq: Once | INTRAMUSCULAR | Status: AC
  Administered 2014-07-22: 0.5 mL via INTRAMUSCULAR

## 2014-07-22 MED ORDER — HYDROXYZINE PAMOATE 25 MG PO CAPS: 25.0000 mg | ORAL_CAPSULE | Freq: Three times a day (TID) | ORAL | Status: DC | PRN

## 2014-07-22 MED ORDER — ESOMEPRAZOLE MAGNESIUM 40 MG PO PACK: 40.0000 mg | PACK | Freq: Every day | ORAL | Status: DC

## 2014-07-22 NOTE — Addendum Note (Signed)
Addended by: Langston Reusing on: 07/22/2014 03:06 PM   Modules accepted: Orders

## 2014-07-22 NOTE — Progress Notes (Signed)
Good fetal mvmt. FH 136 on both sides--will send for bedside US today in our office to see [place] x2 Still having GERD like symptoms, will change prilosec to nexium 40 mg daily.  Gave refills for vistaril and phenergan. Needs [place] for growth q 4 weeks for [facility] twins Continue Lovenox. Signed BTL papers x2.  Cathlean Marseilles and clinic staff will make sure that correct name is on the BTS consent and make note in chart.  Reviewing problem list--need to confirm genetic testing

## 2014-07-22 NOTE — Progress Notes (Signed)
Needs refill on prilosec and phenergan; reports pelvic pressure and severe lower back pain; still reports problems with intense heartburn   Growth ultrasound 3/7 @ 2pm and 4/4 @ 2pm

## 2014-07-22 NOTE — Progress Notes (Signed)
Bedside US for FHT's = Twin A - 140, breech; Twin B - 131, breech.  Dr. Gala Romney notified of results.

## 2014-07-22 NOTE — Progress Notes (Signed)
U/S 07/29/14 @2p .

## 2014-07-23 ENCOUNTER — Telehealth: Payer: Self-pay | Admitting: General Practice

## 2014-07-23 NOTE — Telephone Encounter (Signed)
Patient called and left message stating she had a root canal and a tooth pulled yesterday and is currently in a lot of pain. Patient states that she also feels dizzy and nauseous and feels congested like a cold. She is having difficulty trying to eat or drink and feels dehydrated and really doesn't know what to do but is in a lot of pain. Patient states that she feels like she may need to be admitted to the hospital. Called patient back and she stays that she is just so sore from her procedure and feels like she is also getting a cold. States the dentist office gave her tylenol #3 and she has been tylenol cold but neither are really helping. Recommended to patient she stop the tylenol cold and taking mucinex instead and robitussin if she develops a cough and to contact her dentist office letting them know she is hurting a lot and that the tylenol #3 is not really helping. Recommended to patient to drink plenty of fluids, no sodas and to try things like mashed potatoes, soups, etc. Patient verbalized understanding to all and had no questions

## 2014-07-29 ENCOUNTER — Ambulatory Visit (INDEPENDENT_AMBULATORY_CARE_PROVIDER_SITE_OTHER): Payer: Medicaid Other | Admitting: General Practice

## 2014-07-29 ENCOUNTER — Ambulatory Visit (HOSPITAL_COMMUNITY)
Admission: RE | Admit: 2014-07-29 | Discharge: 2014-07-29 | Disposition: A | Discharge status: Home / Self Care | Payer: Medicaid Other | Source: Ambulatory Visit | Attending: Obstetrics & Gynecology | Admitting: Obstetrics & Gynecology

## 2014-07-29 VITALS — BP 111/59 | HR 76 | Temp 97.8°F | Ht 64.0 in | Wt 148.4 lb

## 2014-07-29 DIAGNOSIS — O4703 False labor before 37 completed weeks of gestation, third trimester: Secondary | ICD-10-CM

## 2014-07-29 DIAGNOSIS — O30042 Twin pregnancy, dichorionic/diamniotic, second trimester: Secondary | ICD-10-CM | POA: Diagnosis not present

## 2014-07-29 DIAGNOSIS — Z3A27 27 weeks gestation of pregnancy: Secondary | ICD-10-CM | POA: Diagnosis not present

## 2014-07-29 DIAGNOSIS — O3421 Maternal care for scar from previous cesarean delivery: Secondary | ICD-10-CM | POA: Diagnosis not present

## 2014-07-29 DIAGNOSIS — O0932 Supervision of pregnancy with insufficient antenatal care, second trimester: Secondary | ICD-10-CM | POA: Insufficient documentation

## 2014-07-29 DIAGNOSIS — O09522 Supervision of elderly multigravida, second trimester: Secondary | ICD-10-CM | POA: Insufficient documentation

## 2014-07-29 DIAGNOSIS — O09213 Supervision of pregnancy with history of pre-term labor, third trimester: Secondary | ICD-10-CM

## 2014-07-30 ENCOUNTER — Ambulatory Visit (HOSPITAL_COMMUNITY): Payer: Medicaid Other

## 2014-08-05 ENCOUNTER — Inpatient Hospital Stay (HOSPITAL_COMMUNITY): Payer: Medicaid Other

## 2014-08-05 ENCOUNTER — Encounter: Payer: Medicaid Other | Admitting: Family Medicine

## 2014-08-05 ENCOUNTER — Inpatient Hospital Stay (HOSPITAL_COMMUNITY)
Admission: AD | Admit: 2014-08-05 | Discharge: 2014-08-05 | Disposition: A | Discharge status: Home / Self Care | Payer: Medicaid Other | Source: Ambulatory Visit | Attending: Obstetrics & Gynecology | Admitting: Obstetrics & Gynecology

## 2014-08-05 ENCOUNTER — Encounter (HOSPITAL_COMMUNITY): Payer: Self-pay | Admitting: *Deleted

## 2014-08-05 DIAGNOSIS — O288 Other abnormal findings on antenatal screening of mother: Secondary | ICD-10-CM | POA: Insufficient documentation

## 2014-08-05 DIAGNOSIS — IMO0002 Reserved for concepts with insufficient information to code with codable children: Secondary | ICD-10-CM

## 2014-08-05 DIAGNOSIS — R079 Chest pain, unspecified: Secondary | ICD-10-CM | POA: Diagnosis present

## 2014-08-05 DIAGNOSIS — R0602 Shortness of breath: Secondary | ICD-10-CM | POA: Diagnosis not present

## 2014-08-05 DIAGNOSIS — O30043 Twin pregnancy, dichorionic/diamniotic, third trimester: Secondary | ICD-10-CM | POA: Insufficient documentation

## 2014-08-05 DIAGNOSIS — R12 Heartburn: Secondary | ICD-10-CM

## 2014-08-05 DIAGNOSIS — O99333 Smoking (tobacco) complicating pregnancy, third trimester: Secondary | ICD-10-CM | POA: Insufficient documentation

## 2014-08-05 DIAGNOSIS — F17210 Nicotine dependence, cigarettes, uncomplicated: Secondary | ICD-10-CM | POA: Diagnosis not present

## 2014-08-05 DIAGNOSIS — O26893 Other specified pregnancy related conditions, third trimester: Secondary | ICD-10-CM

## 2014-08-05 DIAGNOSIS — Z3A28 28 weeks gestation of pregnancy: Secondary | ICD-10-CM | POA: Diagnosis not present

## 2014-08-05 DIAGNOSIS — O9989 Other specified diseases and conditions complicating pregnancy, childbirth and the puerperium: Secondary | ICD-10-CM | POA: Diagnosis not present

## 2014-08-05 HISTORY — DX: Personal history of other venous thrombosis and embolism: Z86.718

## 2014-08-05 LAB — RAPID URINE DRUG SCREEN, HOSP PERFORMED
Amphetamines: NOT DETECTED
BENZODIAZEPINES: NOT DETECTED
Barbiturates: NOT DETECTED
Cocaine: NOT DETECTED
OPIATES: NOT DETECTED
Tetrahydrocannabinol: NOT DETECTED

## 2014-08-05 MED ORDER — HYDROXYPROGESTERONE CAPROATE 250 MG/ML IM OIL
250.0000 mg | TOPICAL_OIL | Freq: Once | INTRAMUSCULAR | Status: AC
  Administered 2014-08-05: 250 mg via INTRAMUSCULAR
  Filled 2014-08-05: qty 1

## 2014-08-05 MED ORDER — RANITIDINE HCL 150 MG PO CAPS: 150.0000 mg | ORAL_CAPSULE | Freq: Two times a day (BID) | ORAL | Status: DC

## 2014-08-05 MED ORDER — PROMETHAZINE HCL 25 MG PO TABS: 12.5000 mg | ORAL_TABLET | Freq: Four times a day (QID) | ORAL | Status: DC | PRN

## 2014-08-05 MED ORDER — HYDROXYZINE PAMOATE 25 MG PO CAPS: 25.0000 mg | ORAL_CAPSULE | Freq: Three times a day (TID) | ORAL | Status: DC | PRN

## 2014-08-05 NOTE — MAU Provider Note (Signed)
History     CSN: [id number]  Arrival date and time: 08/05/14 1521   None     Chief Complaint  Patient presents with  . Chest Pain   HPI  Hannah Gilmore is a 39 y.o. [phone] at [redacted]w[redacted]d who presents to MAU for evaluation of chest pain. States she had an appointment in clinic today, but the clinic was closed. Overall feels well. Her concerns today are regarding chest pain and shortness of breath that has been progressively worsening over the last several months. She is also concerns about knots in her upper extremity.   Chest pain is central, comes and goes. Being treated for reflux in clinic but symptoms not improving. 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 Associated with shortness of breath. Feels as though she can't take a full deep breath. This has been progressively worsening over the last several weeks.   Knots in right upper extremity. Nontender to palpation. Have come up over the last few days. Wants to make sure they are not clots.   +Fetal movement. Denies contractions, loss of fluid, vaginal bleeding, vaginal discharge or other concerning symptoms.   Pregnancy complicated by: - Di/di twins, concordant - AMA - History of DVT in prior pregnancy, on prophylactic Lovenox  - History of preterm delivery, on 17-P - History of c-section x2  OB History    Gravida Para Term Preterm AB TAB SAB Ectopic Multiple Living   9 7 0 7 1  1   7       Past Medical History  Diagnosis Date  . Placenta previa     previous 2 pregnancy  . Medical history non-contributory   . Vaginal Pap smear, abnormal   . Cervical cancer     Past Surgical History  Procedure Laterality Date  . Appendectomy    . Cholecystectomy    . Tonsillectomy    . Abdominal surgery    . Cesarean section      x2  . Cervical cone biopsy      Family History  Problem Relation Age of Onset  . Alcohol abuse Neg Hx     History  Substance Use Topics   . Smoking status: Current Every Day Smoker -- 1.00 packs/day    Types: Cigarettes  . Smokeless tobacco: Never Used  . Alcohol Use: No    Allergies:  Allergies  Allergen Reactions  . Levofloxacin Hives and Rash  . Penicillins Hives and Rash    Facility-administered medications prior to admission  Medication Dose Route Frequency Provider Last Rate Last Dose  . hydroxyprogesterone caproate (DELALUTIN) 250 mg/mL injection 250 mg  250 mg Intramuscular Weekly Peggy Constant, MD   250 mg at 07/29/14 1544   Prescriptions prior to admission  Medication Sig Dispense Refill Last Dose  . acetaminophen (TYLENOL) 500 MG tablet Take 500 mg by mouth every 6 (six) hours as needed for headache.   Taking  . cyclobenzaprine (FLEXERIL) 5 MG tablet Take 1 tablet (5 mg total) by mouth 3 (three) times daily as needed for muscle spasms. 30 tablet 0 Taking  . docusate sodium (COLACE) 100 MG capsule Take 100 mg by mouth daily as needed for mild constipation.    Taking  . enoxaparin (LOVENOX) 40 MG/0.4ML injection Inject 0.4 mLs (40 mg total) into the skin daily. 30 Syringe 6 Taking  . esomeprazole (NEXIUM) 40 MG packet Take 40 mg by mouth daily before breakfast. 30 each 12   .  hydrOXYzine (VISTARIL) 25 MG capsule Take 1 capsule (25 mg total) by mouth 3 (three) times daily as needed. 30 capsule 1   . nicotine (NICODERM CQ - DOSED IN MG/24 HR) 7 mg/24hr patch Place 3 patches (21 mg total) onto the skin daily. 28 patch 3 Taking  . Prenatal Multivit-Min-Fe-FA (PRENATAL VITAMINS) 0.8 MG tablet Take 1 tablet by mouth daily. 30 tablet 12 Taking  . promethazine (PHENERGAN) 25 MG tablet Take 0.5-1 tablets (12.5-25 mg total) by mouth every 6 (six) hours as needed for nausea or vomiting. 30 tablet 0   . pyridoxine (B-6) 100 MG tablet Take 100 mg by mouth daily.   Taking    Review of Systems  Constitutional: Negative.  Negative for fever, chills and malaise/fatigue.  HENT: Negative.  Negative for congestion and sore  throat.   Eyes: Negative.  Negative for double vision and photophobia.  Respiratory: Positive for shortness of breath. Negative for cough and wheezing.   Cardiovascular: Positive for chest pain. Negative for leg swelling.  Gastrointestinal: Positive for heartburn. Negative for nausea, vomiting, abdominal pain, diarrhea and constipation.  Genitourinary: Negative.  Negative for dysuria, urgency, frequency and hematuria.  Musculoskeletal: Negative.  Negative for myalgias.  Skin: Negative.   Neurological: Negative.  Negative for weakness and headaches.  Psychiatric/Behavioral: Negative.   All other systems reviewed and are negative.  Physical Exam   Blood pressure 119/64, pulse 96, temperature 97.7 F (36.5 C), temperature source Oral, resp. rate 22, last menstrual period 01/15/2014, SpO2 100 %.  Physical Exam  Nursing note and vitals reviewed. Constitutional: She is oriented to person, place, and time. She appears well-developed and well-nourished. No distress.  HENT:  Head: Normocephalic and atraumatic.  Cardiovascular: Normal rate, regular rhythm, normal heart sounds and intact distal pulses.   No murmur heard. Respiratory: Effort normal and breath sounds normal. No respiratory distress. She has no wheezes.  Musculoskeletal:  Right upper extremity with three small nodules, non tender to palpation. No erythema or warmth.   Neurological: She is alert and oriented to person, place, and time.  Skin: Skin is warm and dry.  Psychiatric: She has a normal mood and affect. Her behavior is normal.    MAU Course  Procedures  MDM NST Baby A: 130/mod/+A/no decels Baby B: 130/mod/+A/?decel  BPP Baby A: 8/8 Baby B: 8/8  EKG: Normal sinus rhythm with rate of 88 bpm. Normal intervals and axis. TWI in V1. Otherwise no acute TWI or ST segment changes.   Assessment and Plan  Hannah Gilmore is a 39 y.o. [phone] at [redacted]w[redacted]d here with chest pain.   #Chest pain/Shortness of breath Non toxic  appearing. Afebrile with stable vital signs. No s/sxs of infection. EKG with normal sinus rhythm and no evidence of acute changes which is reassuring. Taking Lovenox as prescribed and no evidence of clot on examination. Chest pain likely secondary to reflux and shortness of breath likely related to physiology of third trimester of pregnancy.  - Reassurance provided.  - Continue management of reflux with Nexium. Add zantac 150 mg BID.   #Possible deep variable deceleration Difficult to get both Baby A and Baby B on the monitor for reactive NST. Questionable deceleration although strip not clear at the time. BPP 8/8 x 2. Reassured.   #History of Preterm Labor - Due for 17-P shot today, given in MAU  Follow up in clinic as scheduled in 1 week.   Shelbie Hutching 08/05/2014, 3:37 PM

## 2014-08-05 NOTE — MAU Note (Signed)
Pt states she came to clinic for routine appt & 17P injection, clinic was closed, came to MAU.  Pt has been on lovenox for the last 3 months for hx of blood clots, states she had knots come up on her R arm.  Also states she has L side chest pain that started yesterday.  Twin pregnancy.  Denies bleeding, LOF, or uc's.

## 2014-08-05 NOTE — Discharge Instructions (Signed)
You were seen at [facility] for evaluation of chest pain and shortness of breath. We completed an EKG which was normal. Your chest pain is likely related to reflux. We prescribed a new medication called zantac which you can take twice daily for your symptoms. Your shortness of breath is most likely due to being in your third trimester.    Heartburn During Pregnancy  Heartburn is a burning sensation in the chest caused by stomach acid backing up into the esophagus. Heartburn is common in pregnancy because a certain hormone (progesterone) is released when a woman is pregnant. The progesterone hormone may relax the valve that separates the esophagus from the stomach. This allows acid to go up into the esophagus, causing heartburn. Heartburn may also happen in pregnancy because the enlarging uterus pushes up on the stomach, which pushes more acid into the esophagus. This is especially true in the later stages of pregnancy. Heartburn problems usually go away after giving birth. CAUSES  Heartburn is caused by stomach acid backing up into the esophagus. During pregnancy, this may result from various things, including:   The progesterone hormone.  Changing hormone levels.  The growing uterus pushing stomach acid upward.  Large meals.  Certain foods and drinks.  Exercise.  Increased acid production. SIGNS AND SYMPTOMS   Burning pain in the chest or lower throat.  Bitter taste in the mouth.  Coughing. DIAGNOSIS  Your health care provider will typically diagnose heartburn by taking a careful history of your concern. Blood tests may be done to check for a certain type of bacteria that is associated with heartburn. Sometimes, heartburn is diagnosed by prescribing a heartburn medicine to see if the symptoms improve. In some cases, a procedure called an endoscopy may be done. In this procedure, a tube with a light and a camera on the end (endoscope) is used to examine the esophagus and the  stomach. TREATMENT  Treatment will vary depending on the severity of your symptoms. Your health care provider may recommend:  Over-the-counter medicines (antacids, acid reducers) for mild heartburn.  Prescription medicines to decrease stomach acid or to protect your stomach lining.  Certain changes in your diet.  Elevating the head of your bed by putting blocks under the legs. This helps prevent stomach acid from backing up into the esophagus when you are lying down. HOME CARE INSTRUCTIONS   Only take over-the-counter or prescription medicines as directed by your health care provider.  Raise the head of your bed by putting blocks under the legs if instructed to do so by your health care provider. Sleeping with more pillows is not effective because it only changes the position of your head.  Do not exercise right after eating.  Avoid eating 2-3 hours before bed. Do not lie down right after eating.  Eat small meals throughout the day instead of three large meals.  Identify foods and beverages that make your symptoms worse and avoid them. Foods you may want to avoid include:  Peppers.  Chocolate.  High-fat foods, including fried foods.  Spicy foods.  Garlic and onions.  Citrus fruits, including oranges, grapefruit, lemons, and limes.  Food containing tomatoes or tomato products.  Mint.  Carbonated and caffeinated drinks.  Vinegar. SEEK MEDICAL CARE IF:  You have abdominal pain of any kind.  You feel burning in your upper abdomen or chest, especially after eating or lying down.  You have nausea and vomiting.  Your stomach feels upset after you eat. [place]  IF:   You have severe chest pain that goes down your arm or into your jaw or neck.  You feel sweaty, dizzy, or light-headed.  You become short of breath.  You vomit blood.  You have difficulty or pain with swallowing.  You have bloody or black, tarry stools.  You have episodes of  heartburn more than 3 times a week, for more than 2 weeks. MAKE SURE YOU:  Understand these instructions.  Will watch your condition.  Will get help right away if you are not doing well or get worse. Document Released: 06/04/2000 Document Revised: 06/12/2013 Document Reviewed: 01/24/2013 [facility] Patient Information 2015 [place], [place]. This information is not intended to replace advice given to you by your health care provider. Make sure you discuss any questions you have with your health care provider.

## 2014-08-06 DIAGNOSIS — O288 Other abnormal findings on antenatal screening of mother: Secondary | ICD-10-CM | POA: Insufficient documentation

## 2014-08-06 DIAGNOSIS — Z3A28 28 weeks gestation of pregnancy: Secondary | ICD-10-CM | POA: Insufficient documentation

## 2014-08-06 DIAGNOSIS — O30043 Twin pregnancy, dichorionic/diamniotic, third trimester: Secondary | ICD-10-CM | POA: Insufficient documentation

## 2014-08-13 ENCOUNTER — Encounter: Payer: Self-pay | Admitting: Obstetrics & Gynecology

## 2014-08-13 ENCOUNTER — Ambulatory Visit (INDEPENDENT_AMBULATORY_CARE_PROVIDER_SITE_OTHER): Payer: Medicaid Other | Admitting: Obstetrics & Gynecology

## 2014-08-13 VITALS — BP 115/65 | HR 101 | Wt 150.0 lb

## 2014-08-13 DIAGNOSIS — O30043 Twin pregnancy, dichorionic/diamniotic, third trimester: Secondary | ICD-10-CM

## 2014-08-13 DIAGNOSIS — O09213 Supervision of pregnancy with history of pre-term labor, third trimester: Secondary | ICD-10-CM

## 2014-08-13 LAB — POCT URINALYSIS DIP (DEVICE)
Bilirubin Urine: NEGATIVE
Glucose, UA: NEGATIVE mg/dL
HGB URINE DIPSTICK: NEGATIVE
Ketones, ur: NEGATIVE mg/dL
NITRITE: NEGATIVE
PROTEIN: NEGATIVE mg/dL
SPECIFIC GRAVITY, URINE: 1.01 (ref 1.005–1.030)
UROBILINOGEN UA: 0.2 mg/dL (ref 0.0–1.0)
pH: 6.5 (ref 5.0–8.0)

## 2014-08-13 NOTE — Patient Instructions (Signed)
Multiple Pregnancy A multiple pregnancy is when a woman is pregnant with two or more fetuses. Multiple pregnancies occur in about 3% of all births. The more babies in a pregnancy, the greater the risks of problems to the babies and mother. This includes death. Since the use of Assisted Reproductive Technology (ART) and medications that can induce ovulation, multiple fetal gestation has increased.  RISKS TO THE MOTHER  Preeclampsia and eclampsia.  Postpartum bleeding (hemorrhage).  Kidney infection (pyelonephritis).  Develop anemia.  Develop diabetes.  Liver complications.  A blood clot blocks the artery, or branch of the artery leading to the lungs (pulmonary embolism).  Blood clots in the leg.  Placental separation.  Higher rate of Cesarean Section deliveries.  Women over 33 years old have a higher rate of Downs Syndrome babies. RISKS TO THE BABY  Preterm labor with a premature baby.  Very low birth weight babies that are less than 3 pounds, especially with triplets or mores.  Premature rupture of the membranes.  Twin to twin blood transfusion with one baby anemic and the other baby with too much blood in its system. There may also be heart failure.  With triplets or more, one of the babies is at high risk for cerebral palsy or other neurologic problem.  There is a higher incidence of fetal death. CARE OF MOTHERS WITH MULTIPLE FETAL GESTATION Multiple pregnancies need more care and special prenatal care.  You will see your caregiver more often.  You will have more tests including ultrasounds, nonstress tests and blood tests.  You will have special tests done called amniocentesis and a biophysical profile.  You may be hospitalized more often during the pregnancy.  You will be encouraged to eat a balanced and healthy diet with vitamin and mineral supplements as directed.  You will be asked to get more rest and sleep to keep up your energy.  You will be asked to  restrict your daily activities, exercise, work, household chores and sexual activity.  If you have preterm labor with small babies, you will be given a steroid injection to help the babies lungs mature and do better when born.  The delivery may have to be by Cesarean delivery, especially if there are triplets or more.  The delivery should be in a hospital with an intensive care nursery and Neonatologists (pediatrician for high risk babies) to care for the newborn babies. HOME CARE INSTRUCTIONS   Follow the caregiver's recommendations regarding office visits, tests for you and the babies, diet, rest and medications.  Avoid a large amount of physical activity.  Arrange to have help after the babies are born and when you go home from the hospital.  Take classes on how to care for multiple babies before you deliver them. SEEK IMMEDIATE MEDICAL CARE IF:   You develop a temperature of 102 F (38.9 C) or higher.  You are leaking fluid from the vagina.  You develop vaginal bleeding.  You develop uterine contractions.  You develop a severe headache, severe upper abdominal pain, visual problems or excessive swelling of your face, hands and feet.  You develop severe back pain or leg pain.  You develop severe tiredness.  You develop chest pain.  You have shortness of breath, fall down or pass out. Document Released: 03/16/2008 Document Revised: 08/30/2011 Document Reviewed: 05/11/2013 [facility] Patient Information 2015 [place], [place]. This information is not intended to replace advice given to you by your health care provider. Make sure you discuss any questions you have with  your health care provider.

## 2014-08-13 NOTE — Progress Notes (Signed)
Edema- hands/ feet Pt reports given Keflex antibiotic from dentist for tooth abscess that she had removed

## 2014-08-13 NOTE — Progress Notes (Signed)
Bilateral hip pain.

## 2014-08-14 ENCOUNTER — Encounter: Payer: Self-pay | Admitting: Obstetrics & Gynecology

## 2014-08-20 ENCOUNTER — Ambulatory Visit (INDEPENDENT_AMBULATORY_CARE_PROVIDER_SITE_OTHER): Payer: Medicaid Other | Admitting: Obstetrics & Gynecology

## 2014-08-20 VITALS — BP 118/67 | HR 91 | Temp 98.7°F | Wt 149.8 lb

## 2014-08-20 DIAGNOSIS — O30043 Twin pregnancy, dichorionic/diamniotic, third trimester: Secondary | ICD-10-CM

## 2014-08-20 DIAGNOSIS — O09213 Supervision of pregnancy with history of pre-term labor, third trimester: Secondary | ICD-10-CM

## 2014-08-20 LAB — POCT URINALYSIS DIP (DEVICE)
Bilirubin Urine: NEGATIVE
Glucose, UA: NEGATIVE mg/dL
Hgb urine dipstick: NEGATIVE
Ketones, ur: NEGATIVE mg/dL
NITRITE: NEGATIVE
PH: 6 (ref 5.0–8.0)
PROTEIN: NEGATIVE mg/dL
Specific Gravity, Urine: 1.01 (ref 1.005–1.030)
Urobilinogen, UA: 0.2 mg/dL (ref 0.0–1.0)

## 2014-08-20 MED ORDER — FLUCONAZOLE 150 MG PO TABS
150.0000 mg | ORAL_TABLET | Freq: Once | ORAL | Status: DC
Start: 2014-08-20 — End: 2014-09-02

## 2014-08-20 NOTE — Progress Notes (Signed)
Edema- feet/hands  Pt reports having a yeast infection currently post antibiotic therapy.  She also reports having "knots" on her right arm that are tender to touch.

## 2014-08-20 NOTE — Patient Instructions (Signed)
Multiple Pregnancy A multiple pregnancy is when a woman is pregnant with two or more fetuses. Multiple pregnancies occur in about 3% of all births. The more babies in a pregnancy, the greater the risks of problems to the babies and mother. This includes death. Since the use of Assisted Reproductive Technology (ART) and medications that can induce ovulation, multiple fetal gestation has increased.  RISKS TO THE MOTHER  Preeclampsia and eclampsia.  Postpartum bleeding (hemorrhage).  Kidney infection (pyelonephritis).  Develop anemia.  Develop diabetes.  Liver complications.  A blood clot blocks the artery, or branch of the artery leading to the lungs (pulmonary embolism).  Blood clots in the leg.  Placental separation.  Higher rate of Cesarean Section deliveries.  Women over 38 years old have a higher rate of Downs Syndrome babies. RISKS TO THE BABY  Preterm labor with a premature baby.  Very low birth weight babies that are less than 3 pounds, especially with triplets or mores.  Premature rupture of the membranes.  Twin to twin blood transfusion with one baby anemic and the other baby with too much blood in its system. There may also be heart failure.  With triplets or more, one of the babies is at high risk for cerebral palsy or other neurologic problem.  There is a higher incidence of fetal death. CARE OF MOTHERS WITH MULTIPLE FETAL GESTATION Multiple pregnancies need more care and special prenatal care.  You will see your caregiver more often.  You will have more tests including ultrasounds, nonstress tests and blood tests.  You will have special tests done called amniocentesis and a biophysical profile.  You may be hospitalized more often during the pregnancy.  You will be encouraged to eat a balanced and healthy diet with vitamin and mineral supplements as directed.  You will be asked to get more rest and sleep to keep up your energy.  You will be asked to  restrict your daily activities, exercise, work, household chores and sexual activity.  If you have preterm labor with small babies, you will be given a steroid injection to help the babies lungs mature and do better when born.  The delivery may have to be by Cesarean delivery, especially if there are triplets or more.  The delivery should be in a hospital with an intensive care nursery and Neonatologists (pediatrician for high risk babies) to care for the newborn babies. HOME CARE INSTRUCTIONS   Follow the caregiver's recommendations regarding office visits, tests for you and the babies, diet, rest and medications.  Avoid a large amount of physical activity.  Arrange to have help after the babies are born and when you go home from the hospital.  Take classes on how to care for multiple babies before you deliver them. SEEK IMMEDIATE MEDICAL CARE IF:   You develop a temperature of 102 F (38.9 C) or higher.  You are leaking fluid from the vagina.  You develop vaginal bleeding.  You develop uterine contractions.  You develop a severe headache, severe upper abdominal pain, visual problems or excessive swelling of your face, hands and feet.  You develop severe back pain or leg pain.  You develop severe tiredness.  You develop chest pain.  You have shortness of breath, fall down or pass out. Document Released: 03/16/2008 Document Revised: 08/30/2011 Document Reviewed: 05/11/2013 [facility] Patient Information 2015 [place], [place]. This information is not intended to replace advice given to you by your health care provider. Make sure you discuss any questions you have with  your health care provider.

## 2014-08-20 NOTE — Progress Notes (Signed)
Upper abdominal pressure. No UC,has [place] in 1 week

## 2014-08-26 ENCOUNTER — Ambulatory Visit (HOSPITAL_COMMUNITY)
Admission: RE | Admit: 2014-08-26 | Discharge: 2014-08-26 | Disposition: A | Discharge status: Home / Self Care | Payer: Medicaid Other | Source: Ambulatory Visit | Attending: Obstetrics & Gynecology | Admitting: Obstetrics & Gynecology

## 2014-08-26 ENCOUNTER — Ambulatory Visit (INDEPENDENT_AMBULATORY_CARE_PROVIDER_SITE_OTHER): Payer: Medicaid Other

## 2014-08-26 VITALS — BP 109/66 | HR 80 | Wt 152.9 lb

## 2014-08-26 DIAGNOSIS — O99333 Smoking (tobacco) complicating pregnancy, third trimester: Secondary | ICD-10-CM | POA: Diagnosis not present

## 2014-08-26 DIAGNOSIS — O3421 Maternal care for scar from previous cesarean delivery: Secondary | ICD-10-CM | POA: Diagnosis not present

## 2014-08-26 DIAGNOSIS — O09523 Supervision of elderly multigravida, third trimester: Secondary | ICD-10-CM | POA: Diagnosis present

## 2014-08-26 DIAGNOSIS — O30043 Twin pregnancy, dichorionic/diamniotic, third trimester: Secondary | ICD-10-CM | POA: Diagnosis not present

## 2014-08-26 DIAGNOSIS — Z3A31 31 weeks gestation of pregnancy: Secondary | ICD-10-CM | POA: Insufficient documentation

## 2014-08-26 DIAGNOSIS — O09212 Supervision of pregnancy with history of pre-term labor, second trimester: Secondary | ICD-10-CM

## 2014-08-26 DIAGNOSIS — O09293 Supervision of pregnancy with other poor reproductive or obstetric history, third trimester: Secondary | ICD-10-CM | POA: Insufficient documentation

## 2014-08-26 DIAGNOSIS — O09213 Supervision of pregnancy with history of pre-term labor, third trimester: Secondary | ICD-10-CM

## 2014-08-26 DIAGNOSIS — O2230 Deep phlebothrombosis in pregnancy, unspecified trimester: Secondary | ICD-10-CM

## 2014-08-26 DIAGNOSIS — F17210 Nicotine dependence, cigarettes, uncomplicated: Secondary | ICD-10-CM | POA: Diagnosis not present

## 2014-08-26 DIAGNOSIS — O30042 Twin pregnancy, dichorionic/diamniotic, second trimester: Secondary | ICD-10-CM

## 2014-08-29 ENCOUNTER — Ambulatory Visit (INDEPENDENT_AMBULATORY_CARE_PROVIDER_SITE_OTHER): Payer: Medicaid Other | Admitting: Family Medicine

## 2014-08-29 VITALS — BP 121/66 | HR 79 | Temp 97.2°F | Wt 152.1 lb

## 2014-08-29 DIAGNOSIS — O30043 Twin pregnancy, dichorionic/diamniotic, third trimester: Secondary | ICD-10-CM

## 2014-08-29 LAB — POCT URINALYSIS DIP (DEVICE)
BILIRUBIN URINE: NEGATIVE
Glucose, UA: NEGATIVE mg/dL
Hgb urine dipstick: NEGATIVE
Ketones, ur: NEGATIVE mg/dL
NITRITE: NEGATIVE
Protein, ur: NEGATIVE mg/dL
SPECIFIC GRAVITY, URINE: 1.01 (ref 1.005–1.030)
Urobilinogen, UA: 0.2 mg/dL (ref 0.0–1.0)
pH: 7 (ref 5.0–8.0)

## 2014-08-29 NOTE — Progress Notes (Signed)
C/o lower back pain and pelvic pressure.

## 2014-08-29 NOTE — Progress Notes (Signed)
Patient is 39 y.o. [id number] [redacted]w[redacted]d.  +FM, denies LOF, VB, contractions, vaginal discharge.  Feeling upper abdominal pressure, constant, overall very uncomfortable but no contractions. - received 17-P on Monday, will schedule next visit on Monday so visit and 17-P can be on same day.

## 2014-09-02 ENCOUNTER — Ambulatory Visit (INDEPENDENT_AMBULATORY_CARE_PROVIDER_SITE_OTHER): Payer: Medicaid Other | Admitting: Obstetrics & Gynecology

## 2014-09-02 VITALS — BP 99/62 | HR 92 | Temp 98.3°F | Wt 152.9 lb

## 2014-09-02 DIAGNOSIS — O09213 Supervision of pregnancy with history of pre-term labor, third trimester: Secondary | ICD-10-CM

## 2014-09-02 DIAGNOSIS — O30043 Twin pregnancy, dichorionic/diamniotic, third trimester: Secondary | ICD-10-CM

## 2014-09-02 LAB — POCT URINALYSIS DIP (DEVICE)
BILIRUBIN URINE: NEGATIVE
GLUCOSE, UA: NEGATIVE mg/dL
Hgb urine dipstick: NEGATIVE
Ketones, ur: NEGATIVE mg/dL
Nitrite: NEGATIVE
PROTEIN: NEGATIVE mg/dL
Specific Gravity, Urine: 1.01 (ref 1.005–1.030)
UROBILINOGEN UA: 0.2 mg/dL (ref 0.0–1.0)
pH: 6 (ref 5.0–8.0)

## 2014-09-02 NOTE — Progress Notes (Signed)
Heartburn better with Zantac, nsg note reviewed.

## 2014-09-02 NOTE — Progress Notes (Signed)
C/o having a lot of hip pain at night and more contractions/pain.

## 2014-09-02 NOTE — Patient Instructions (Signed)
Multiple Pregnancy A multiple pregnancy is when a woman is pregnant with two or more fetuses. Multiple pregnancies occur in about 3% of all births. The more babies in a pregnancy, the greater the risks of problems to the babies and mother. This includes death. Since the use of Assisted Reproductive Technology (ART) and medications that can induce ovulation, multiple fetal gestation has increased.  RISKS TO THE MOTHER  Preeclampsia and eclampsia.  Postpartum bleeding (hemorrhage).  Kidney infection (pyelonephritis).  Develop anemia.  Develop diabetes.  Liver complications.  A blood clot blocks the artery, or branch of the artery leading to the lungs (pulmonary embolism).  Blood clots in the leg.  Placental separation.  Higher rate of Cesarean Section deliveries.  Women over 84 years old have a higher rate of Downs Syndrome babies. RISKS TO THE BABY  Preterm labor with a premature baby.  Very low birth weight babies that are less than 3 pounds, especially with triplets or mores.  Premature rupture of the membranes.  Twin to twin blood transfusion with one baby anemic and the other baby with too much blood in its system. There may also be heart failure.  With triplets or more, one of the babies is at high risk for cerebral palsy or other neurologic problem.  There is a higher incidence of fetal death. CARE OF MOTHERS WITH MULTIPLE FETAL GESTATION Multiple pregnancies need more care and special prenatal care.  You will see your caregiver more often.  You will have more tests including ultrasounds, nonstress tests and blood tests.  You will have special tests done called amniocentesis and a biophysical profile.  You may be hospitalized more often during the pregnancy.  You will be encouraged to eat a balanced and healthy diet with vitamin and mineral supplements as directed.  You will be asked to get more rest and sleep to keep up your energy.  You will be asked to  restrict your daily activities, exercise, work, household chores and sexual activity.  If you have preterm labor with small babies, you will be given a steroid injection to help the babies lungs mature and do better when born.  The delivery may have to be by Cesarean delivery, especially if there are triplets or more.  The delivery should be in a hospital with an intensive care nursery and Neonatologists (pediatrician for high risk babies) to care for the newborn babies. HOME CARE INSTRUCTIONS   Follow the caregiver's recommendations regarding office visits, tests for you and the babies, diet, rest and medications.  Avoid a large amount of physical activity.  Arrange to have help after the babies are born and when you go home from the hospital.  Take classes on how to care for multiple babies before you deliver them. SEEK IMMEDIATE MEDICAL CARE IF:   You develop a temperature of 102 F (38.9 C) or higher.  You are leaking fluid from the vagina.  You develop vaginal bleeding.  You develop uterine contractions.  You develop a severe headache, severe upper abdominal pain, visual problems or excessive swelling of your face, hands and feet.  You develop severe back pain or leg pain.  You develop severe tiredness.  You develop chest pain.  You have shortness of breath, fall down or pass out. Document Released: 03/16/2008 Document Revised: 08/30/2011 Document Reviewed: 05/11/2013 [facility] Patient Information 2015 [place], [place]. This information is not intended to replace advice given to you by your health care provider. Make sure you discuss any questions you have with  your health care provider.

## 2014-09-09 ENCOUNTER — Encounter: Payer: Medicaid Other | Admitting: Family Medicine

## 2014-09-10 ENCOUNTER — Ambulatory Visit (INDEPENDENT_AMBULATORY_CARE_PROVIDER_SITE_OTHER): Payer: Medicaid Other | Admitting: Obstetrics & Gynecology

## 2014-09-10 VITALS — BP 107/74 | HR 97 | Temp 97.6°F | Wt 156.3 lb

## 2014-09-10 DIAGNOSIS — O30043 Twin pregnancy, dichorionic/diamniotic, third trimester: Secondary | ICD-10-CM

## 2014-09-10 DIAGNOSIS — O09212 Supervision of pregnancy with history of pre-term labor, second trimester: Secondary | ICD-10-CM

## 2014-09-10 LAB — POCT URINALYSIS DIP (DEVICE)
Bilirubin Urine: NEGATIVE
Glucose, UA: NEGATIVE mg/dL
HGB URINE DIPSTICK: NEGATIVE
KETONES UR: NEGATIVE mg/dL
Nitrite: NEGATIVE
Protein, ur: NEGATIVE mg/dL
SPECIFIC GRAVITY, URINE: 1.015 (ref 1.005–1.030)
UROBILINOGEN UA: 0.2 mg/dL (ref 0.0–1.0)
pH: 6 (ref 5.0–8.0)

## 2014-09-10 MED ORDER — CYCLOBENZAPRINE HCL 5 MG PO TABS: 5.0000 mg | ORAL_TABLET | Freq: Three times a day (TID) | ORAL | Status: DC | PRN

## 2014-09-10 NOTE — Progress Notes (Signed)
Flexeril refill for hip pain at night.

## 2014-09-10 NOTE — Patient Instructions (Signed)

## 2014-09-17 ENCOUNTER — Encounter: Payer: Medicaid Other | Admitting: Obstetrics & Gynecology

## 2014-09-23 ENCOUNTER — Ambulatory Visit (HOSPITAL_COMMUNITY): Payer: Medicaid Other

## 2014-10-03 ENCOUNTER — Telehealth: Payer: Self-pay | Admitting: *Deleted

## 2014-10-03 NOTE — Telephone Encounter (Signed)
Called pt and left message stating that she has not been seen @ our office since 09/10/14. Please call to schedule Ob fu appt ASAP or to let us know if you have delivered at another hospital.

## 2014-10-07 NOTE — Telephone Encounter (Signed)
Attempted to contact patient on both numbers that are provided.  No answer, left message for patient to contact clinic and give an update on status of pregnancy.

## 2014-10-09 NOTE — Telephone Encounter (Signed)
Contacted patient, patient delivered twins in [place].

## 2014-10-14 ENCOUNTER — Emergency Department (HOSPITAL_COMMUNITY)
Admission: EM | Admit: 2014-10-14 | Discharge: 2014-10-15 | Disposition: A | Discharge status: Home / Self Care | Payer: Medicaid Other | Attending: Emergency Medicine | Admitting: Emergency Medicine

## 2014-10-14 ENCOUNTER — Encounter (HOSPITAL_COMMUNITY): Payer: Self-pay | Admitting: *Deleted

## 2014-10-14 DIAGNOSIS — Z8541 Personal history of malignant neoplasm of cervix uteri: Secondary | ICD-10-CM | POA: Insufficient documentation

## 2014-10-14 DIAGNOSIS — R1030 Lower abdominal pain, unspecified: Secondary | ICD-10-CM | POA: Diagnosis not present

## 2014-10-14 DIAGNOSIS — Z792 Long term (current) use of antibiotics: Secondary | ICD-10-CM | POA: Insufficient documentation

## 2014-10-14 DIAGNOSIS — Z7902 Long term (current) use of antithrombotics/antiplatelets: Secondary | ICD-10-CM | POA: Diagnosis not present

## 2014-10-14 DIAGNOSIS — L7682 Other postprocedural complications of skin and subcutaneous tissue: Secondary | ICD-10-CM

## 2014-10-14 DIAGNOSIS — Z9889 Other specified postprocedural states: Secondary | ICD-10-CM | POA: Insufficient documentation

## 2014-10-14 DIAGNOSIS — Z720 Tobacco use: Secondary | ICD-10-CM | POA: Diagnosis not present

## 2014-10-14 DIAGNOSIS — G8918 Other acute postprocedural pain: Secondary | ICD-10-CM | POA: Diagnosis not present

## 2014-10-14 DIAGNOSIS — Z79899 Other long term (current) drug therapy: Secondary | ICD-10-CM | POA: Diagnosis not present

## 2014-10-14 DIAGNOSIS — Z880 Allergy status to penicillin: Secondary | ICD-10-CM | POA: Insufficient documentation

## 2014-10-14 DIAGNOSIS — Z86718 Personal history of other venous thrombosis and embolism: Secondary | ICD-10-CM | POA: Diagnosis not present

## 2014-10-14 DIAGNOSIS — R109 Unspecified abdominal pain: Secondary | ICD-10-CM | POA: Diagnosis present

## 2014-10-14 LAB — CBC WITH DIFFERENTIAL/PLATELET
Basophils Absolute: 0 10*3/uL (ref 0.0–0.1)
Basophils Relative: 0 % (ref 0–1)
EOS ABS: 0.3 10*3/uL (ref 0.0–0.7)
EOS PCT: 5 % (ref 0–5)
HCT: 39.4 % (ref 36.0–46.0)
Hemoglobin: 13.6 g/dL (ref 12.0–15.0)
Lymphocytes Relative: 40 % (ref 12–46)
Lymphs Abs: 2.2 10*3/uL (ref 0.7–4.0)
MCH: 30.6 pg (ref 26.0–34.0)
MCHC: 34.5 g/dL (ref 30.0–36.0)
MCV: 88.5 fL (ref 78.0–100.0)
Monocytes Absolute: 0.3 10*3/uL (ref 0.1–1.0)
Monocytes Relative: 5 % (ref 3–12)
Neutro Abs: 2.8 10*3/uL (ref 1.7–7.7)
Neutrophils Relative %: 50 % (ref 43–77)
Platelets: 132 10*3/uL — ABNORMAL LOW (ref 150–400)
RBC: 4.45 MIL/uL (ref 3.87–5.11)
RDW: 12 % (ref 11.5–15.5)
WBC: 5.5 10*3/uL (ref 4.0–10.5)

## 2014-10-14 MED ORDER — ONDANSETRON 4 MG PO TBDP
4.0000 mg | ORAL_TABLET | Freq: Once | ORAL | Status: AC
  Administered 2014-10-14: 4 mg via ORAL
  Filled 2014-10-14: qty 1

## 2014-10-14 MED ORDER — OXYCODONE-ACETAMINOPHEN 5-325 MG PO TABS
1.0000 | ORAL_TABLET | Freq: Once | ORAL | Status: AC
  Administered 2014-10-14: 1 via ORAL
  Filled 2014-10-14: qty 1

## 2014-10-14 NOTE — ED Notes (Signed)
Pt reporting c-section incision was infected, and was started on cipro, Keflex and bactrim on 4/19.  States medications are making her nauseated, and denies improvement in infection, pt also reports that she wasn't given medication for pain and doesn't have relief from Ibuprofen.

## 2014-10-14 NOTE — ED Provider Notes (Signed)
CSN: [id number]     Arrival date & time 10/14/14  2134 History   First MD Initiated Contact with Patient 10/14/14 2302     Chief Complaint  Patient presents with  . Wound Infection     (Consider location/radiation/quality/duration/timing/severity/associated sxs/prior Treatment) HPI Hannah Gilmore is a 39 y.o. female who presents to the ED with pain to her C/S site. She states that she delivered twins in [place] and when for follow up last week due to pain. Her doctor told her she had an infection and started her on Keflex, Cipro and Bactrim. She complains of nausea from the antibiotics. She states she continues to have pain in the lower abdomen and ibuprofen is not helping.   Past Medical History  Diagnosis Date  . Placenta previa     previous 2 pregnancy  . Medical history non-contributory   . Vaginal Pap smear, abnormal   . Cervical cancer   . H/O blood clots    Past Surgical History  Procedure Laterality Date  . Appendectomy    . Cholecystectomy    . Tonsillectomy    . Abdominal surgery    . Cesarean section      x2  . Cervical cone biopsy     Family History  Problem Relation Age of Onset  . Alcohol abuse Neg Hx    History  Substance Use Topics  . Smoking status: Current Every Day Smoker -- 1.00 packs/day    Types: Cigarettes  . Smokeless tobacco: Never Used  . Alcohol Use: No   OB History    Gravida Para Term Preterm AB TAB SAB Ectopic Multiple Living   9 7 0 7 1  1   7      Review of Systems Negative except as stated in HPI   Allergies  Levofloxacin and Penicillins  Home Medications   Prior to Admission medications   Medication Sig Start Date End Date Taking? Authorizing Provider  cephALEXin (KEFLEX) 500 MG capsule Take 500 mg by mouth 4 (four) times daily.   Yes Historical Provider, MD  ciprofloxacin (CIPRO) 500 MG tablet Take 500 mg by mouth 2 (two) times daily.   Yes Historical Provider, MD  sulfamethoxazole-trimethoprim (BACTRIM DS,SEPTRA DS) 800-160  MG per tablet Take 1 tablet by mouth 2 (two) times daily.   Yes Historical Provider, MD  acetaminophen (TYLENOL) 500 MG tablet Take 500 mg by mouth every 6 (six) hours as needed for headache.    Historical Provider, MD  cyclobenzaprine (FLEXERIL) 5 MG tablet Take 1 tablet (5 mg total) by mouth 3 (three) times daily as needed for muscle spasms. 09/10/14   Woodroe Mode, MD  docusate sodium (COLACE) 100 MG capsule Take 100 mg by mouth daily as needed for mild constipation.     Historical Provider, MD  enoxaparin (LOVENOX) 40 MG/0.4ML injection Inject 0.4 mLs (40 mg total) into the skin daily. 04/23/14   Truett Mainland, DO  HYDROcodone-acetaminophen (NORCO/VICODIN) 5-325 MG per tablet Take 1 tablet by mouth every 4 (four) hours as needed. 10/15/14   Loie Jahr Bunnie Pion, NP  ondansetron (ZOFRAN) 4 MG tablet Take 1 tablet (4 mg total) by mouth every 6 (six) hours. 10/15/14   Manning, NP  Prenatal Multivit-Min-Fe-FA (PRENATAL VITAMINS) 0.8 MG tablet Take 1 tablet by mouth daily. 05/24/14   Woodroe Mode, MD  ranitidine (ZANTAC) 150 MG capsule Take 1 capsule (150 mg total) by mouth 2 (two) times daily. 08/05/14   Shelbie Hutching, MD  BP 96/61 mmHg  Pulse 57  Temp(Src) 97.9 F (36.6 C) (Oral)  Resp 16  Ht 5\' 4"  (1.626 m)  Wt 139 lb (63.05 kg)  BMI 23.85 kg/m2  SpO2 98%  LMP 01/15/2014  Breastfeeding? Unknown Physical Exam  Constitutional: She is oriented to person, place, and time. She appears well-developed and well-nourished.  HENT:  Head: Normocephalic.  Eyes: Conjunctivae and EOM are normal.  Neck: Neck supple.  Cardiovascular: Normal rate.   Pulmonary/Chest: Effort normal.  Abdominal: Soft. Bowel sounds are normal. There is no tenderness. There is no rebound and no guarding.  Healing c/s incision without erythema or red streaking noted. There is no drainage from the incision site. There is tenderness with palpation of the lower abdomen.   Musculoskeletal: Normal range of motion.   Neurological: She is alert and oriented to person, place, and time. No cranial nerve deficit.  Skin: Skin is warm and dry.  Psychiatric: She has a normal mood and affect. Her behavior is normal.  Nursing note and vitals reviewed.   ED Course  Procedures (including critical care time) Results for orders placed or performed during the hospital encounter of 10/14/14 (from the past 24 hour(s))  CBC with Differential/Platelet     Status: Abnormal   Collection Time: 10/14/14 11:17 PM  Result Value Ref Range   WBC 5.5 4.0 - 10.5 K/uL   RBC 4.45 3.87 - 5.11 MIL/uL   Hemoglobin 13.6 12.0 - 15.0 g/dL   HCT 39.4 36.0 - 46.0 %   MCV 88.5 78.0 - 100.0 fL   MCH 30.6 26.0 - 34.0 pg   MCHC 34.5 30.0 - 36.0 g/dL   RDW 12.0 11.5 - 15.5 %   Platelets 132 (L) 150 - 400 K/uL   Neutrophils Relative % 50 43 - 77 %   Neutro Abs 2.8 1.7 - 7.7 K/uL   Lymphocytes Relative 40 12 - 46 %   Lymphs Abs 2.2 0.7 - 4.0 K/uL   Monocytes Relative 5 3 - 12 %   Monocytes Absolute 0.3 0.1 - 1.0 K/uL   Eosinophils Relative 5 0 - 5 %   Eosinophils Absolute 0.3 0.0 - 0.7 K/uL   Basophils Relative 0 0 - 1 %   Basophils Absolute 0.0 0.0 - 0.1 K/uL  Urinalysis, Routine w reflex microscopic     Status: Abnormal   Collection Time: 10/15/14 12:20 AM  Result Value Ref Range   Color, Urine YELLOW YELLOW   APPearance CLEAR CLEAR   Specific Gravity, Urine >1.030 (H) 1.005 - 1.030   pH 5.5 5.0 - 8.0   Glucose, UA NEGATIVE NEGATIVE mg/dL   Hgb urine dipstick NEGATIVE NEGATIVE   Bilirubin Urine NEGATIVE NEGATIVE   Ketones, ur NEGATIVE NEGATIVE mg/dL   Protein, ur NEGATIVE NEGATIVE mg/dL   Urobilinogen, UA 0.2 0.0 - 1.0 mg/dL   Nitrite NEGATIVE NEGATIVE   Leukocytes, UA NEGATIVE NEGATIVE    After pain medication and Zofran for nausea patient is without pain and sleeping in exam room.  MDM  39 y.o. female with hx of infected C/S incision and taking three different antibiotics. Stable for d/c without signs of incision site  infection. She will call her OB tomorrow for follow up. Will give pre pack of pain medication and Zofran Rx. Discussed with the patient clinical and lab findings and plan of care and all questioned fully answered.   Final diagnoses:  Pain at surgical incision      Ashley Murrain, NP 10/15/14 0104  Delora Fuel, MD 75/10/25 [id number]

## 2014-10-15 LAB — URINALYSIS, ROUTINE W REFLEX MICROSCOPIC
Bilirubin Urine: NEGATIVE
GLUCOSE, UA: NEGATIVE mg/dL
Hgb urine dipstick: NEGATIVE
KETONES UR: NEGATIVE mg/dL
LEUKOCYTES UA: NEGATIVE
Nitrite: NEGATIVE
Protein, ur: NEGATIVE mg/dL
Specific Gravity, Urine: >1.03 — ABNORMAL HIGH (ref 1.005–1.030)
UROBILINOGEN UA: 0.2 mg/dL (ref 0.0–1.0)
pH: 5.5 (ref 5.0–8.0)

## 2014-10-15 MED ORDER — ONDANSETRON HCL 4 MG PO TABS: 4.0000 mg | ORAL_TABLET | Freq: Four times a day (QID) | ORAL | Status: DC

## 2014-10-15 MED ORDER — HYDROCODONE-ACETAMINOPHEN 5-325 MG PO TABS: 1.0000 | ORAL_TABLET | ORAL | Status: DC | PRN

## 2014-10-15 NOTE — Discharge Instructions (Signed)
Your incision site is healing well without redness of signs of infection tonight. Call your doctor tomorrow to discuss pain management. Your blood work and urine tonight are normal. Do not take the narcotic pain medication if driving as it will make you sleepy.

## 2014-10-15 NOTE — ED Notes (Signed)
Pt was given a pre-package of hydrocodone-acetaminophen quantity six and instructions on use, patient verbally understands.

## 2014-10-29 MED FILL — Hydrocodone-Acetaminophen Tab 5-325 MG: ORAL | Qty: 6 | Status: AC

## 2014-11-23 ENCOUNTER — Emergency Department (HOSPITAL_COMMUNITY)
Admission: EM | Admit: 2014-11-23 | Discharge: 2014-11-23 | Disposition: A | Discharge status: Home / Self Care | Payer: Medicaid Other | Attending: Emergency Medicine | Admitting: Emergency Medicine

## 2014-11-23 ENCOUNTER — Encounter (HOSPITAL_COMMUNITY): Payer: Self-pay | Admitting: *Deleted

## 2014-11-23 DIAGNOSIS — Z79899 Other long term (current) drug therapy: Secondary | ICD-10-CM | POA: Insufficient documentation

## 2014-11-23 DIAGNOSIS — Z720 Tobacco use: Secondary | ICD-10-CM | POA: Insufficient documentation

## 2014-11-23 DIAGNOSIS — Z792 Long term (current) use of antibiotics: Secondary | ICD-10-CM | POA: Insufficient documentation

## 2014-11-23 DIAGNOSIS — Z8541 Personal history of malignant neoplasm of cervix uteri: Secondary | ICD-10-CM | POA: Diagnosis not present

## 2014-11-23 DIAGNOSIS — K0889 Other specified disorders of teeth and supporting structures: Secondary | ICD-10-CM

## 2014-11-23 DIAGNOSIS — K088 Other specified disorders of teeth and supporting structures: Secondary | ICD-10-CM | POA: Diagnosis present

## 2014-11-23 DIAGNOSIS — Z880 Allergy status to penicillin: Secondary | ICD-10-CM | POA: Diagnosis not present

## 2014-11-23 MED ORDER — HYDROCODONE-ACETAMINOPHEN 5-325 MG PO TABS
1.0000 | ORAL_TABLET | Freq: Once | ORAL | Status: AC
  Administered 2014-11-23: 1 via ORAL
  Filled 2014-11-23: qty 1

## 2014-11-23 MED ORDER — NAPROXEN 500 MG PO TABS: 500.0000 mg | ORAL_TABLET | Freq: Two times a day (BID) | ORAL | Status: DC

## 2014-11-23 MED ORDER — DIPHENHYDRAMINE HCL 25 MG PO CAPS
25.0000 mg | ORAL_CAPSULE | Freq: Once | ORAL | Status: AC
  Administered 2014-11-23: 25 mg via ORAL
  Filled 2014-11-23: qty 1

## 2014-11-23 MED ORDER — CLINDAMYCIN HCL 150 MG PO CAPS
150.0000 mg | ORAL_CAPSULE | Freq: Once | ORAL | Status: AC
  Administered 2014-11-23: 150 mg via ORAL
  Filled 2014-11-23: qty 1

## 2014-11-23 MED ORDER — CLINDAMYCIN HCL 150 MG PO CAPS: 150.0000 mg | ORAL_CAPSULE | Freq: Four times a day (QID) | ORAL | Status: DC

## 2014-11-23 NOTE — ED Provider Notes (Signed)
CSN: [id number]     Arrival date & time 11/23/14  2113 History   First MD Initiated Contact with Patient 11/23/14 2137    This chart was scribed for Tanna Furry, MD by Terressa Koyanagi, ED Scribe. This patient was seen in room APA19/APA19 and the patient's care was started at 10:06 PM.  Chief Complaint  Patient presents with  . Dental Pain   The history is provided by the patient. No language interpreter was used.   PCP: No PCP Per Patient HPI Comments: Hannah Gilmore is a 39 y.o. female, with PMH noted below, who presents to the Emergency Department complaining of constant, throbbing, upper and lower teeth pain onset 3 days ago. Pt rates her pain a 10 out of 10.   Past Medical History  Diagnosis Date  . Placenta previa     previous 2 pregnancy  . Medical history non-contributory   . Vaginal Pap smear, abnormal   . Cervical cancer   . H/O blood clots    Past Surgical History  Procedure Laterality Date  . Appendectomy    . Cholecystectomy    . Tonsillectomy    . Abdominal surgery    . Cesarean section      x2  . Cervical cone biopsy     Family History  Problem Relation Age of Onset  . Alcohol abuse Neg Hx    History  Substance Use Topics  . Smoking status: Current Every Day Smoker -- 1.00 packs/day    Types: Cigarettes  . Smokeless tobacco: Never Used  . Alcohol Use: No   OB History    Gravida Para Term Preterm AB TAB SAB Ectopic Multiple Living   9 7 0 7 1  1   7      Review of Systems  Constitutional: Negative for fever, chills, diaphoresis, appetite change and fatigue.  HENT: Positive for dental problem. Negative for mouth sores, sore throat and trouble swallowing.   Eyes: Negative for visual disturbance.  Respiratory: Negative for cough, chest tightness, shortness of breath and wheezing.   Cardiovascular: Negative for chest pain.  Gastrointestinal: Negative for nausea, vomiting, abdominal pain, diarrhea and abdominal distention.  Endocrine: Negative for  polydipsia, polyphagia and polyuria.  Genitourinary: Negative for dysuria, frequency and hematuria.  Musculoskeletal: Negative for gait problem.  Skin: Negative for color change, pallor and rash.  Neurological: Negative for dizziness, syncope, light-headedness and headaches.  Hematological: Does not bruise/bleed easily.  Psychiatric/Behavioral: Negative for behavioral problems and confusion.    Allergies  Levofloxacin and Penicillins  Home Medications   Prior to Admission medications   Medication Sig Start Date End Date Taking? Authorizing Provider  acetaminophen (TYLENOL) 500 MG tablet Take 500 mg by mouth every 6 (six) hours as needed for headache.   Yes Historical Provider, MD  ibuprofen (ADVIL,MOTRIN) 200 MG tablet Take 400 mg by mouth every 6 (six) hours as needed.   Yes Historical Provider, MD  Prenatal Multivit-Min-Fe-FA (PRENATAL VITAMINS) 0.8 MG tablet Take 1 tablet by mouth daily. 05/24/14  Yes Woodroe Mode, MD  cephALEXin (KEFLEX) 500 MG capsule Take 500 mg by mouth 4 (four) times daily.    Historical Provider, MD  clindamycin (CLEOCIN) 150 MG capsule Take 1 capsule (150 mg total) by mouth every 6 (six) hours. 11/23/14   Tanna Furry, MD  cyclobenzaprine (FLEXERIL) 5 MG tablet Take 1 tablet (5 mg total) by mouth 3 (three) times daily as needed for muscle spasms. Patient not taking: Reported on 11/23/2014 09/10/14  Woodroe Mode, MD  enoxaparin (LOVENOX) 40 MG/0.4ML injection Inject 0.4 mLs (40 mg total) into the skin daily. Patient not taking: Reported on 11/23/2014 04/23/14   Truett Mainland, DO  HYDROcodone-acetaminophen (NORCO/VICODIN) 5-325 MG per tablet Take 1 tablet by mouth every 4 (four) hours as needed. Patient not taking: Reported on 11/23/2014 10/15/14   Ashley Murrain, NP  naproxen (NAPROSYN) 500 MG tablet Take 1 tablet (500 mg total) by mouth 2 (two) times daily. 11/23/14   Tanna Furry, MD  ondansetron (ZOFRAN) 4 MG tablet Take 1 tablet (4 mg total) by mouth every 6 (six)  hours. Patient not taking: Reported on 11/23/2014 10/15/14   Ashley Murrain, NP  ranitidine (ZANTAC) 150 MG capsule Take 1 capsule (150 mg total) by mouth 2 (two) times daily. Patient not taking: Reported on 11/23/2014 08/05/14   Shelbie Hutching, MD   Triage Vitals: BP 133/82 mmHg  Pulse 82  Temp(Src) 98.7 F (37.1 C) (Oral)  Resp 22  Ht 5\' 4"  (1.626 m)  Wt 132 lb (59.875 kg)  BMI 22.65 kg/m2  SpO2 99%  LMP 11/23/2014 Physical Exam  Constitutional: She is oriented to person, place, and time. She appears well-developed and well-nourished. No distress.  HENT:  Head: Normocephalic.  Mouth/Throat:    Eyes: Conjunctivae are normal. Pupils are equal, round, and reactive to light. No scleral icterus.  Neck: Normal range of motion. Neck supple. No thyromegaly present.  Cardiovascular: Normal rate and regular rhythm.  Exam reveals no gallop and no friction rub.   No murmur heard. Pulmonary/Chest: Effort normal and breath sounds normal. No respiratory distress. She has no wheezes. She has no rales.  Abdominal: Soft. Bowel sounds are normal. She exhibits no distension. There is no tenderness. There is no rebound.  Musculoskeletal: Normal range of motion.  Neurological: She is alert and oriented to person, place, and time.  Skin: Skin is warm and dry. No rash noted.  Psychiatric: She has a normal mood and affect. Her behavior is normal.    ED Course  Procedures (including critical care time) DIAGNOSTIC STUDIES: Oxygen Saturation is 99% on RA, nl by my interpretation.    COORDINATION OF CARE: 10:07 PM-Discussed treatment plan which includes antibiotics, dentist referral, meds for pain and  f/u with dentist with pt at bedside and pt agreed to plan.   Labs Review Labs Reviewed - No data to display  Imaging Review No results found.   EKG Interpretation None      MDM   Final diagnoses:  Pain, dental    I personally performed the services described in this documentation, which was  scribed in my presence. The recorded information has been reviewed and is accurate.     Tanna Furry, MD 11/23/14 2251

## 2014-11-23 NOTE — ED Notes (Signed)
Pt c/o dental pain that started three days ago,

## 2014-11-23 NOTE — ED Notes (Addendum)
Pt c/o generalized itching.  States that she isn't allergic to antibiotic given, but sometimes she does itch after any antibiotic. Requesting dose of benadryl.

## 2014-11-23 NOTE — Discharge Instructions (Signed)

## 2015-08-14 IMAGING — US US OB FOLLOW-UP EACH ADDL GEST (MODIFY)
1 series · 14 of 28 positions shown · non-contrast
Comparison: none

[Series 1: us ob follow-up each addl gest (modify) · 0.21mm/px · 40 acquisitions, 14 frames shown]
[im 2/40]
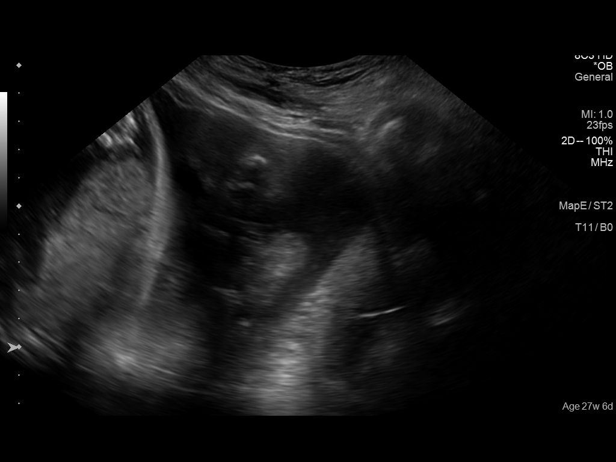
[im 5/40]
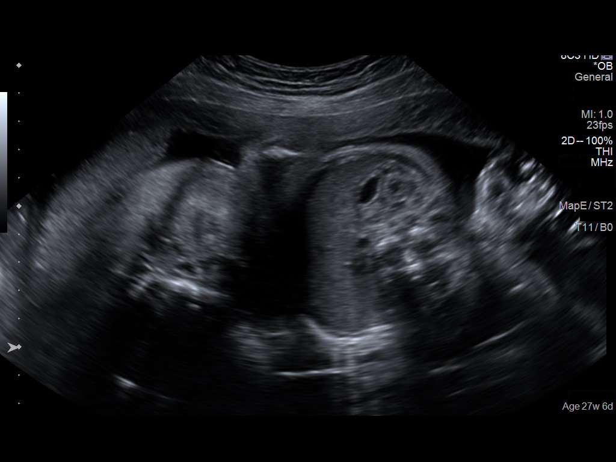
[im 8/40]
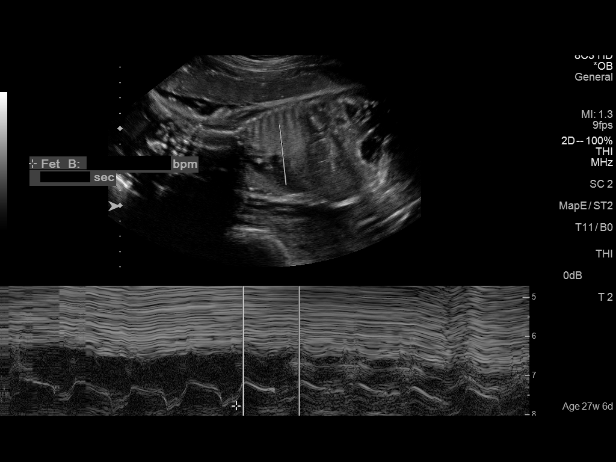
[im 11/40]
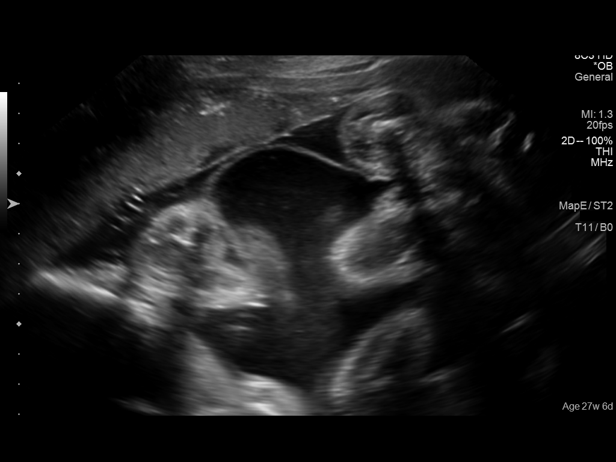
[im 14/40]
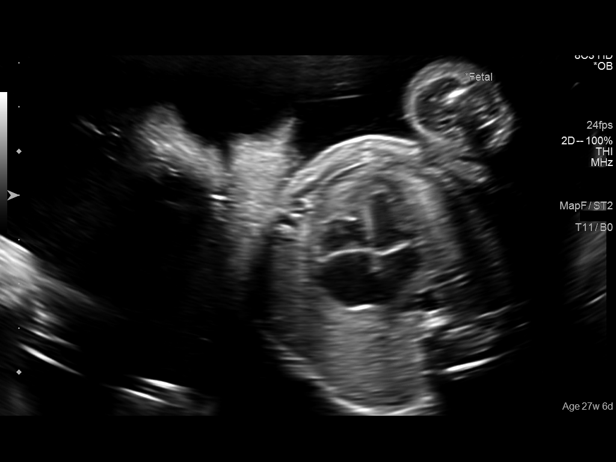
[im 16/40]
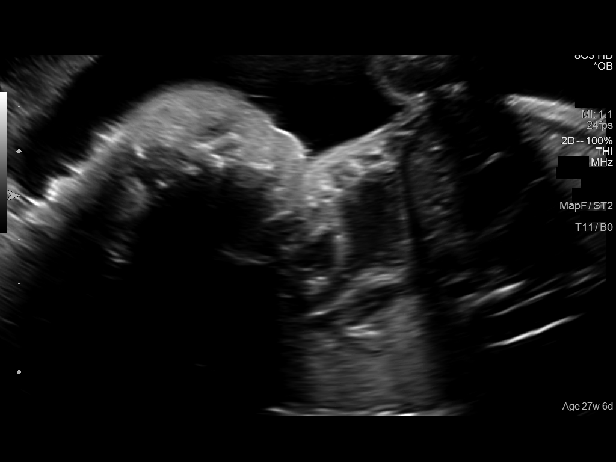
[im 19/40]
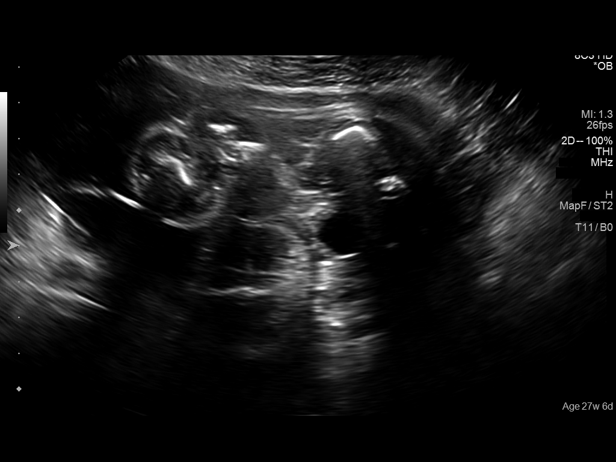
[im 22/40]
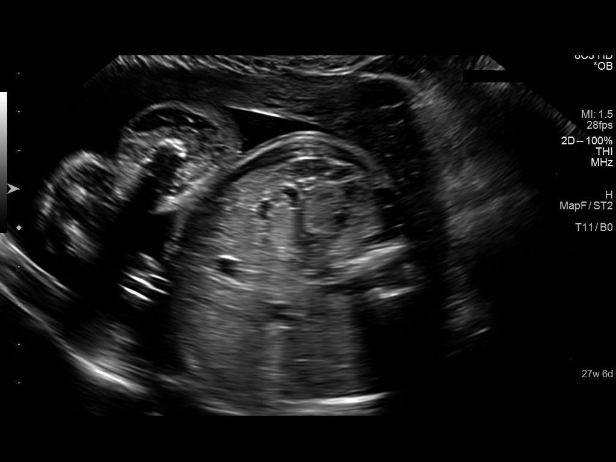
[im 25/40]
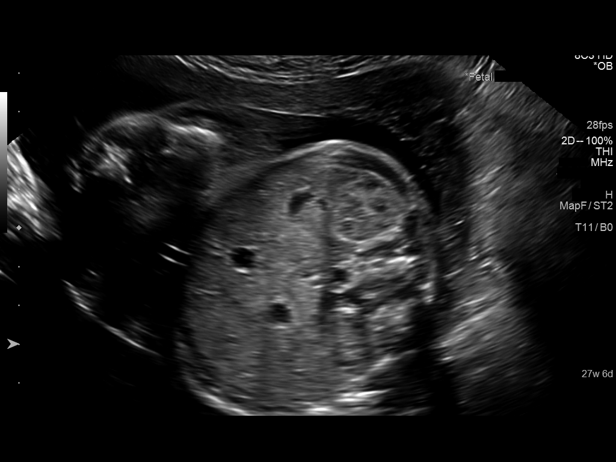
[im 28/40]
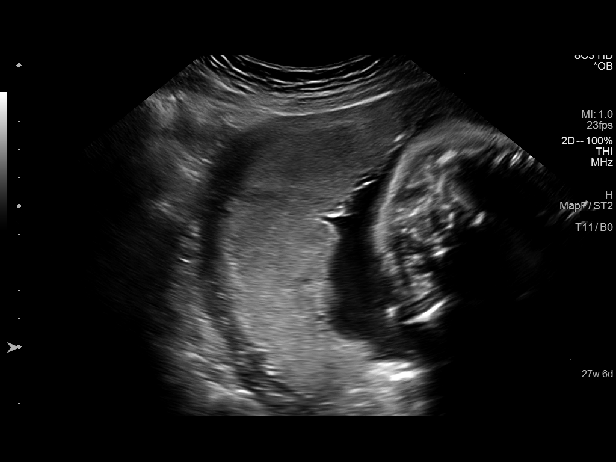
[im 31/40]
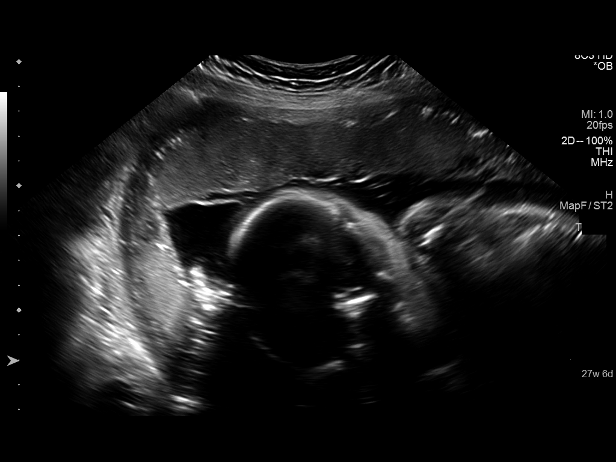
[im 34/40]
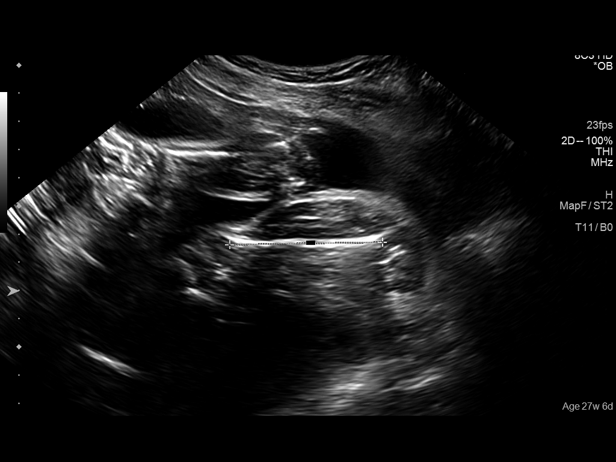
[im 37/40]
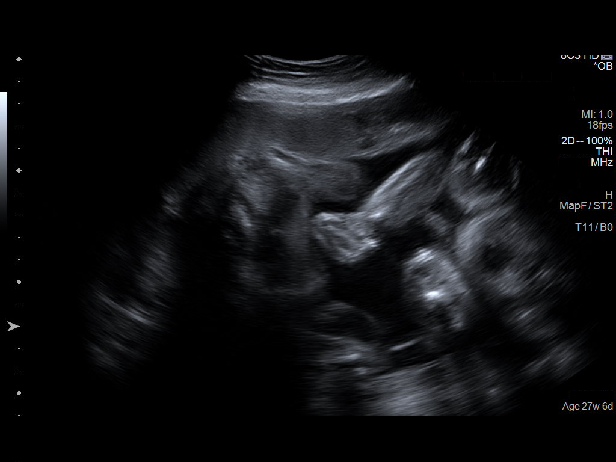
[im 40/40]
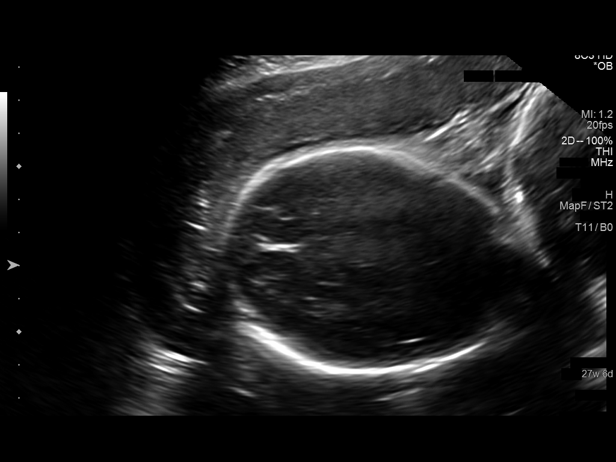

[14 of 28 positions shown; findings below may reference images not displayed]

OBSTETRICS REPORT
                    (Corrected Final 08/01/2014 [DATE])

 Name:       QUIRIJN AMAZIGH               Visit Date: 07/29/2014 [DATE]

Service(s) Provided

 US OB FOLLOW UP                                       76816.1
 US OB FOLLOW UP ADDL GEST                             76816.2
Indications

 Advanced maternal age multigravida 36+, second
 trimester
 Poor obstetric history: Previous preterm delivery x
 7 (30 - 34 weeks)
 Previous cesarean section (2)
 Twin pregnancy, di/di, second trimester
 Previous cervical surgery
 Deep vein thrombosis (DVT)
 No or Little Prenatal Care
 27 weeks gestation of pregnancy
Fetal Evaluation (Fetus A)

 Num Of Fetuses:    2
 Fetal Heart Rate:  155                          bpm
 Cardiac Activity:  Observed
 Fetal Lie:         Maternal right side
 Presentation:      Breech
 Placenta:          Anterior, above cervical os
 P. Cord            Previously Visualized
 Insertion:

 Membrane Desc:     Dividing
                    Membrane seen
                    - Dichorionic.

 Amniotic Fluid
 AFI FV:      Subjectively within normal limits
                                             Larg Pckt:     3.5  cm
Biometry (Fetus A)

 BPD:     67.9  mm     G. Age:  27w 3d                CI:        71.36   70 - 86
                                                      FL/HC:      21.2   18.8 -

 HC:       256  mm     G. Age:  27w 6d       20  %    HC/AC:      1.09   1.05 -

 AC:     235.7  mm     G. Age:  27w 6d       43  %    FL/BPD:     79.8   71 - 87
 FL:      54.2  mm     G. Age:  28w 5d       59  %    FL/AC:      23.0   20 - 24

 Est. FW:    6624  gm      2 lb 9 oz     56  %     FW Discordancy      0 \ 1 %
Gestational Age (Fetus A)

 LMP:           27w 6d        Date:  01/15/14                 EDD:   10/22/14
 U/S Today:     28w 0d                                        EDD:   10/21/14
 Best:          27w 6d     Det. By:  LMP  (01/15/14)          EDD:   10/22/14
Anatomy (Fetus A)

 Cranium:          Appears normal         Aortic Arch:      Previously seen
 Fetal Cavum:      Previously seen        Ductal Arch:      Previously seen
 Ventricles:       Appears normal         Diaphragm:        Previously seen
 Choroid Plexus:   Previously seen        Stomach:          Appears normal, left
                                                            sided
 Cerebellum:       Previously seen        Abdomen:          Appears normal
 Posterior Fossa:  Previously seen        Abdominal Wall:   Previously seen
 Nuchal Fold:      Previously seen        Cord Vessels:     Previously seen
 Face:             Orbits and profile     Kidneys:          Previously seen
                   previously seen
 Lips:             Previously seen        Bladder:          Previously seen
 Heart:            Previously seen        Spine:            Previously seen
 RVOT:             Previously seen        Lower             Previously seen
                                          Extremities:
 LVOT:             Previously seen        Upper             Previously seen
                                          Extremities:

 Other:  Fetus appears to be a female. Lt heel prev. visualized. Technically
         difficult due to fetal position.

Fetal Evaluation (Fetus B)

 Num Of Fetuses:    2
 Fetal Heart Rate:  137                          bpm
 Cardiac Activity:  Observed
 Fetal Lie:         Maternal left side
 Presentation:      Breech
 Placenta:          Anterior, above cervical os
 P. Cord            Previously Visualized
 Insertion:

 Membrane Desc:     Dividing
                    Membrane seen
                    - Dichorionic.

 Amniotic Fluid
 AFI FV:      Subjectively within normal limits
                                             Larg Pckt:     3.1  cm
Biometry (Fetus B)

 BPD:     69.3  mm     G. Age:  27w 6d                CI:        73.07   70 - 86
                                                      FL/HC:      21.1   18.8 -

 HC:     257.7  mm     G. Age:  28w 0d       24  %    HC/AC:      1.12   1.05 -

 AC:       231  mm     G. Age:  27w 4d       30  %    FL/BPD:     78.6   71 - 87
 FL:      54.5  mm     G. Age:  28w 6d       63  %    FL/AC:      23.6   20 - 24

 Est. FW:    7757  gm      2 lb 9 oz     54  %     FW Discordancy         1  %
Gestational Age (Fetus B)

 LMP:           27w 6d        Date:  01/15/14                 EDD:   10/22/14
 U/S Today:     28w 1d                                        EDD:   10/20/14
 Best:          27w 6d     Det. By:  LMP  (01/15/14)          EDD:   10/22/14
Anatomy (Fetus B)

 Cranium:          Appears normal         Aortic Arch:      Previously seen
 Fetal Cavum:      Previously seen        Ductal Arch:      Previously seen
 Ventricles:       Appears normal         Diaphragm:        Previously seen
 Choroid Plexus:   Previously seen        Stomach:          Appears normal, left
                                                            sided
 Cerebellum:       Previously seen        Abdomen:          Appears normal
 Posterior Fossa:  Previously seen        Abdominal Wall:   Previously seen
 Nuchal Fold:      Previously seen        Cord Vessels:     Previously seen
 Face:             Orbits and profile     Kidneys:          Appear normal
                   previously seen
 Lips:             Previously seen        Bladder:          Appears normal
 Heart:            Appears normal         Spine:            Previously seen
                   (4CH, axis, and
                   situs)
 RVOT:             Previously seen        Lower             Previously seen
                                          Extremities:
 LVOT:             Appears normal         Upper             Previously seen
                                          Extremities:

 Other:  Fetus appears to be a male. 5th digit prev. visualized. Technically
         difficult due to fetal position.
Cervix Uterus Adnexa

 Cervical Length:    2.6      cm

 Cervix:       Normal appearance by transabdominal scan.

 Adnexa:     No abnormality visualized.
Impression

 DC/DA twin gestation with best dates of 27w 6d
 Interval growth is appropriate and concordant
 (difficult to determine fetal presentation on today's study;
 male fetus appears to be presenting twin on maternal left)

 Twin A: (female infant)
 Breech, anterior placenta, maternal right.
 Interval growth is appropriate (54th %tile)
 Normal amniotic fluid volume

 Twin B: (male infant)
 Maternal left, breech, anterior placenta
 Interval growth is appropriate (56th %tile)
 Normal amniotic fluid volume
Recommendations

 Recommend follow-up ultrasound examination for interval
 growth in 4 weeks.

## 2015-10-11 ENCOUNTER — Emergency Department (HOSPITAL_COMMUNITY)
Admission: EM | Admit: 2015-10-11 | Discharge: 2015-10-11 | Disposition: A | Discharge status: Home / Self Care | Payer: Self-pay | Attending: Emergency Medicine | Admitting: Emergency Medicine

## 2015-10-11 ENCOUNTER — Encounter (HOSPITAL_COMMUNITY): Payer: Self-pay | Admitting: Emergency Medicine

## 2015-10-11 ENCOUNTER — Emergency Department (HOSPITAL_COMMUNITY): Payer: Self-pay

## 2015-10-11 DIAGNOSIS — K029 Dental caries, unspecified: Secondary | ICD-10-CM

## 2015-10-11 DIAGNOSIS — F17210 Nicotine dependence, cigarettes, uncomplicated: Secondary | ICD-10-CM | POA: Insufficient documentation

## 2015-10-11 DIAGNOSIS — Z79899 Other long term (current) drug therapy: Secondary | ICD-10-CM | POA: Insufficient documentation

## 2015-10-11 DIAGNOSIS — R319 Hematuria, unspecified: Secondary | ICD-10-CM

## 2015-10-11 DIAGNOSIS — R109 Unspecified abdominal pain: Secondary | ICD-10-CM

## 2015-10-11 LAB — URINE MICROSCOPIC-ADD ON

## 2015-10-11 LAB — URINALYSIS, ROUTINE W REFLEX MICROSCOPIC
BILIRUBIN URINE: NEGATIVE
Glucose, UA: NEGATIVE mg/dL
Ketones, ur: NEGATIVE mg/dL
Leukocytes, UA: NEGATIVE
NITRITE: NEGATIVE
PROTEIN: NEGATIVE mg/dL
pH: 6.5 (ref 5.0–8.0)

## 2015-10-11 LAB — PREGNANCY, URINE: PREG TEST UR: NEGATIVE

## 2015-10-11 MED ORDER — KETOROLAC TROMETHAMINE 60 MG/2ML IM SOLN
60.0000 mg | Freq: Once | INTRAMUSCULAR | Status: AC
  Administered 2015-10-11: 60 mg via INTRAMUSCULAR
  Filled 2015-10-11: qty 2

## 2015-10-11 MED ORDER — CLINDAMYCIN HCL 150 MG PO CAPS: ORAL_CAPSULE | ORAL | Status: AC

## 2015-10-11 MED ORDER — METHOCARBAMOL 500 MG PO TABS: 1000.0000 mg | ORAL_TABLET | Freq: Four times a day (QID) | ORAL | Status: AC | PRN

## 2015-10-11 NOTE — Discharge Instructions (Signed)
Take the prescriptions as directed.  Apply moist heat or ice to the area(s) of discomfort, for 15 minutes at a time, several times per day for the next few days.  Do not fall asleep on a heating or ice pack.  Call your regular medical doctor, the dentist, and the Urologist on Monday to schedule follow up appointments this week.  Return to the Emergency Department immediately if worsening.

## 2015-10-11 NOTE — ED Notes (Signed)
Pt states she has been having right flank pain for over a week.  States she has been having dysuria with frequency and urgency as well.

## 2015-10-11 NOTE — ED Provider Notes (Signed)
CSN: [id number]     Arrival date & time 10/11/15  1837 History   First MD Initiated Contact with Patient 10/11/15 1845     Chief Complaint  Patient presents with  . Flank Pain      HPI Pt was seen at 1845. Per pt, c/o gradual onset and persistence of constant right sided flank "pain" that began 1 to 2 weeks ago.  Pt describes the pain as "sharp," and radiating into the right side of her abd.  Has been associated with urinary frequency.  Denies vaginal bleeding/discharge, no dysuria/hematuria, no abd pain, no diarrhea, no black or blood in emesis, no CP/SOB, no fevers, no rash. Per pt, c/o gradual onset and persistence of constant right lower tooth "pain" for the past several months.  Denies fevers, no intra-oral edema, no rash, no facial swelling, no dysphagia, no neck pain.    Past Medical History  Diagnosis Date  . Placenta previa     previous 2 pregnancy  . Medical history non-contributory   . Vaginal Pap smear, abnormal   . Cervical cancer ([place])   . H/O blood clots    Past Surgical History  Procedure Laterality Date  . Appendectomy    . Cholecystectomy    . Tonsillectomy    . Abdominal surgery    . Cesarean section      x2  . Cervical cone biopsy     Family History  Problem Relation Age of Onset  . Alcohol abuse Neg Hx    Social History  Substance Use Topics  . Smoking status: Current Every Day Smoker -- 1.00 packs/day    Types: Cigarettes  . Smokeless tobacco: Never Used  . Alcohol Use: No   OB History    Gravida Para Term Preterm AB TAB SAB Ectopic Multiple Living   9 7 0 7 1  1   7      Review of Systems ROS: Statement: All systems negative except as marked or noted in the HPI; Constitutional: Negative for fever and chills. ; ; Eyes: Negative for eye pain and discharge. ; ; ENMT: Positive for dental caries, dental hygiene poor and toothache. Negative for ear pain, bleeding gums, dental injury, facial deformity, facial swelling, hoarseness, nasal congestion,  sinus pressure, sore throat, throat swelling and tongue swollen. ; ; Cardiovascular: Negative for chest pain, palpitations, diaphoresis, dyspnea and peripheral edema. ; ; Respiratory: Negative for cough, wheezing and stridor. ; ; Gastrointestinal: Negative for nausea, vomiting, diarrhea and abdominal pain. ; ; Genitourinary: +flank pain, urinary frequency. Negative for dysuria and hematuria. ; ; Musculoskeletal: Negative for neck pain. ; ; Skin: Negative for rash and skin lesion. ; ; Neuro: Negative for headache, lightheadedness and neck stiffness.     Allergies  Aspirin; Levofloxacin; and Penicillins  Home Medications   Prior to Admission medications   Medication Sig Start Date End Date Taking? Authorizing Provider  acetaminophen (TYLENOL) 500 MG tablet Take 500 mg by mouth every 6 (six) hours as needed for headache.   Yes Historical Provider, MD  benztropine (COGENTIN) 1 MG tablet Take 1 mg by mouth at bedtime.   Yes Historical Provider, MD  lithium carbonate 300 MG capsule Take 300-600 mg by mouth 2 (two) times daily. 2 in the morning and 1 at bedtime   Yes Historical Provider, MD  omeprazole (PRILOSEC) 40 MG capsule Take 40 mg by mouth daily before breakfast.   Yes Historical Provider, MD  Prenatal MV-Min-Fe Fum-FA-DHA (PRENATAL MULTIVITAMIN PLUS DHA) 27-0.8-250 MG CAPS Take  1 tablet by mouth daily.   Yes Historical Provider, MD  QUEtiapine (SEROQUEL) 100 MG tablet Take 150 mg by mouth at bedtime.   Yes Historical Provider, MD   BP 135/81 mmHg  Pulse 95  Temp(Src) 98.5 F (36.9 C) (Temporal)  Resp 18  Ht 5\' 4"  (1.626 m)  Wt 139 lb (63.05 kg)  BMI 23.85 kg/m2  SpO2 98%  LMP  Physical Exam  1850: Physical examination: Vital signs and O2 SAT: Reviewed; Constitutional: Well developed, Well nourished, Well hydrated, In no acute distress; Head and Face: Normocephalic, Atraumatic; Eyes: EOMI, PERRL, No scleral icterus; ENMT: Mouth and pharynx normal, Poor dentition, Widespread dental decay  with multiple missing teeth. Mucous membranes moist, +lower right lateral incisor with extensive dental decay.  No gingival erythema, edema, fluctuance, or drainage.  No intra-oral edema. No submandibular or sublingual edema. No hoarse voice, no drooling, no stridor. No trismus. ; Neck: Supple, Full range of motion, No lymphadenopathy; Cardiovascular: Regular rate and rhythm, No murmur, rub, or gallop; Respiratory: Breath sounds clear & equal bilaterally, No rales, rhonchi, wheezes.  Speaking full sentences with ease, Normal respiratory effort/excursion; Chest: Nontender, Movement normal; Abdomen: Soft, Nontender, Nondistended, Normal bowel sounds; Genitourinary: No CVA tenderness; Spine:  No midline CS, TS, LS tenderness. +TTP right lumbar paraspinal muscles.;; Extremities: Pulses normal, No tenderness, No edema, No calf edema or asymmetry.; Neuro: AA&Ox3, Major CN grossly intact.  Speech clear. No gross focal motor or sensory deficits in extremities. Climbs on and off stretcher easily by herself. Gait steady.; Skin: Color normal, Warm, Dry.   ED Course  Procedures (including critical care time) Labs Review   Imaging Review  I have personally reviewed and evaluated these images and lab results as part of my medical decision-making.   EKG Interpretation None      MDM  MDM Reviewed: previous chart, nursing note and vitals Interpretation: labs and CT scan     Results for orders placed or performed during the hospital encounter of 10/11/15  Pregnancy, urine  Result Value Ref Range   Preg Test, Ur NEGATIVE NEGATIVE  Urinalysis, Routine w reflex microscopic  Result Value Ref Range   Color, Urine YELLOW YELLOW   APPearance CLEAR CLEAR   Specific Gravity, Urine <1.005 (L) 1.005 - 1.030   pH 6.5 5.0 - 8.0   Glucose, UA NEGATIVE NEGATIVE mg/dL   Hgb urine dipstick LARGE (A) NEGATIVE   Bilirubin Urine NEGATIVE NEGATIVE   Ketones, ur NEGATIVE NEGATIVE mg/dL   Protein, ur NEGATIVE NEGATIVE  mg/dL   Nitrite NEGATIVE NEGATIVE   Leukocytes, UA NEGATIVE NEGATIVE  Urine microscopic-add on  Result Value Ref Range   Squamous Epithelial / LPF 0-5 (A) NONE SEEN   WBC, UA 0-5 0 - 5 WBC/hpf   RBC / HPF TOO NUMEROUS TO COUNT 0 - 5 RBC/hpf   Bacteria, UA FEW (A) NONE SEEN   Ct Renal Stone Study 10/11/2015  CLINICAL DATA:  Pt states she has been having right flank pain for over a week. States she has been having dysuria with frequency and urgency as well. hx renal stones, appendectomy and cholecystectomy. EXAM: CT ABDOMEN AND PELVIS WITHOUT CONTRAST TECHNIQUE: Multidetector CT imaging of the abdomen and pelvis was performed following the standard protocol without IV contrast. COMPARISON:  CT 10/11/2015 at [facility] FINDINGS: Lower chest: Lung bases are clear. Hepatobiliary: No focal hepatic lesion. Postcholecystectomy. No biliary dilatation. Pancreas: Pancreas is normal. No ductal dilatation. No pancreatic inflammation. Spleen: Normal spleen Adrenals/urinary tract: Adrenal glands and  kidneys are normal. The ureters and bladder normal. Stomach/Bowel: Stomach, small bowel, and cecum are normal. The colon and rectosigmoid colon are normal. Vascular/Lymphatic: Abdominal aorta is normal caliber with atherosclerotic calcification. There is no retroperitoneal or periportal lymphadenopathy. No pelvic lymphadenopathy. Reproductive: Uterus and ovaries are normal. Other: No free fluid. Musculoskeletal: No aggressive osseous lesion. IMPRESSION: 1. No nephrolithiasis, ureterolithiasis or obstructive uropathy. 2. Post appendectomy and cholecystectomy 3. No acute abdominal findings. No change from CT several hours earlier. Findings conveyed toKATHLEEN Marquesha Robideau on 10/11/2015  at20:06. Electronically Signed   By: Suzy Bouchard M.D.   On: 10/11/2015 20:06    2010:  Call from Rads MD as above. Pt queried regarding why she did not tell ED staff about this previous OSH ED visit several hours ago. Pt stated she "just wanted  someone else to check it out."  Informed she will be discharged now. Pt verb understanding. Pt encouraged to f/u with dentist or oral surgeon for her dental needs for good continuity of care and definitive treatment.  Pt verb understanding. Dx and testing d/w pt and family.  Questions answered.  Verb understanding, agreeable to d/c home with outpt f/u.     Francine Graven, DO 10/15/15 1444

## 2016-10-26 IMAGING — CT CT RENAL STONE PROTOCOL
3 of 4 series · 7 of 46 positions shown, 13 images · non-contrast
Comparison: CT 10/11/2015 at [facility]

CLINICAL DATA: Pt states she has been having right flank pain for
over a week. States she has been having dysuria with frequency and
urgency as well. hx renal stones, appendectomy and cholecystectomy.

EXAM:
CT ABDOMEN AND PELVIS WITHOUT CONTRAST
TECHNIQUE: Multidetector CT imaging of the abdomen and pelvis was performed
following the standard protocol without IV contrast.

[Series 3: lung 5.0 b60f · axial · 0.66mm/px · z∈[-98,-68]mm · 3 of 14 slices shown, 7 images]
[im 4/14  soft-tissue]
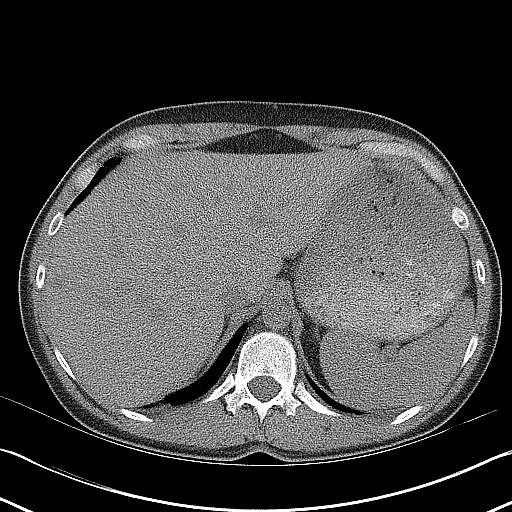
[im 4/14  lung]
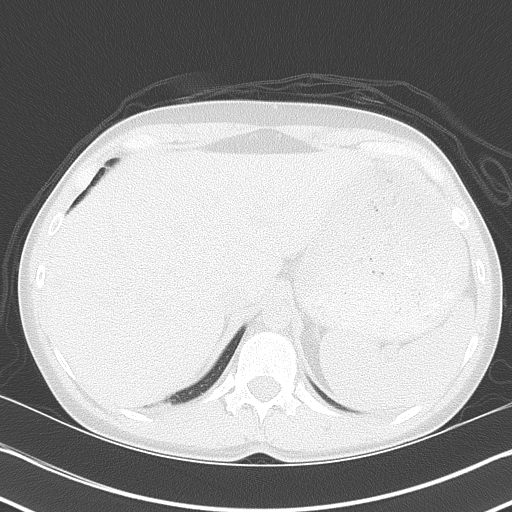
[im 4/14  bone]
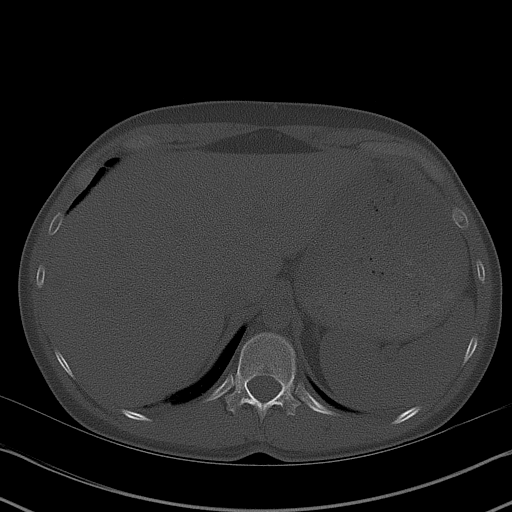
[im 7/14  soft-tissue]
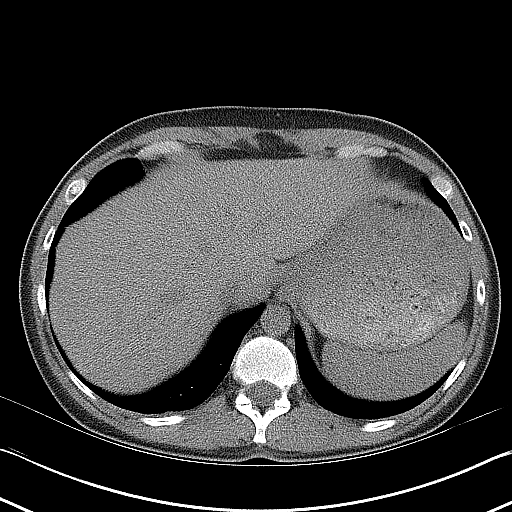
[im 7/14  lung]
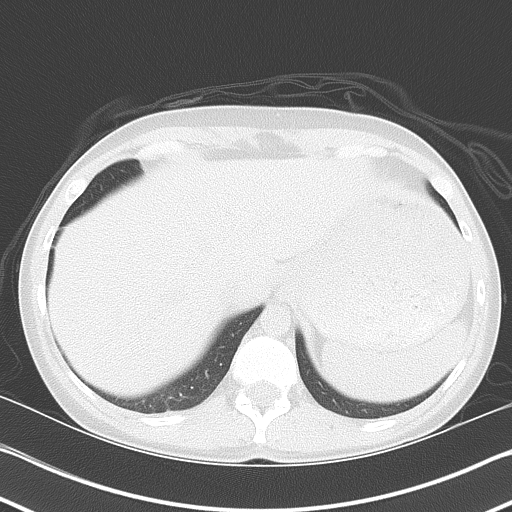
[im 10/14  soft-tissue]
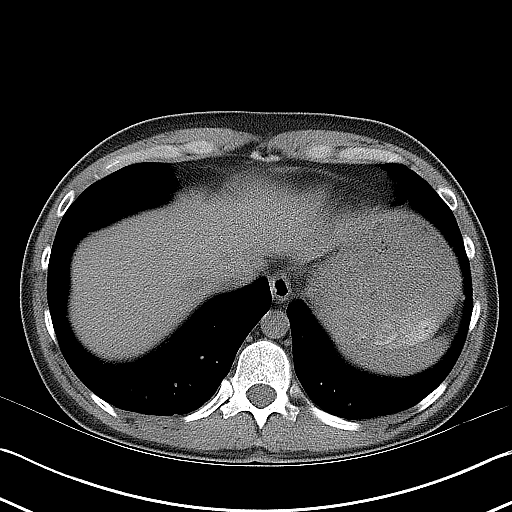
[im 10/14  lung]
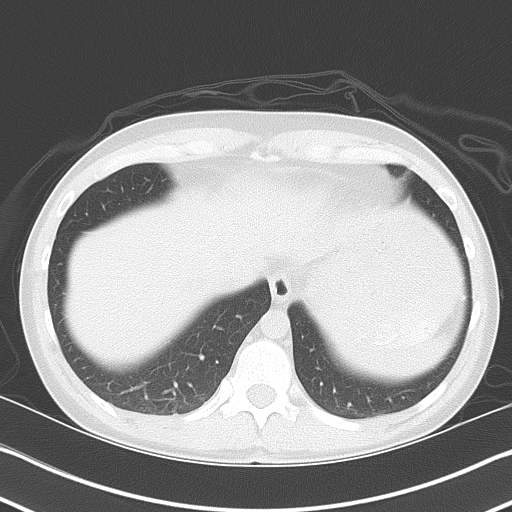

[Series 4: mpr coronal (id) · coronal · 0.67mm/px · 3 of 80 slices shown, 4 images]
[im 27/80  soft-tissue]
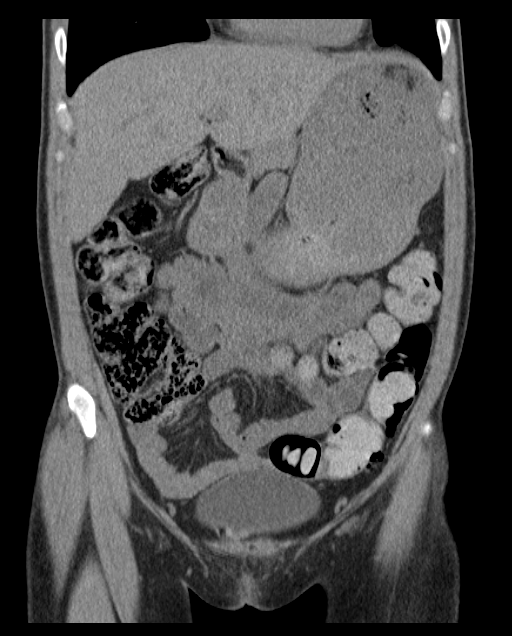
[im 36/80  soft-tissue]
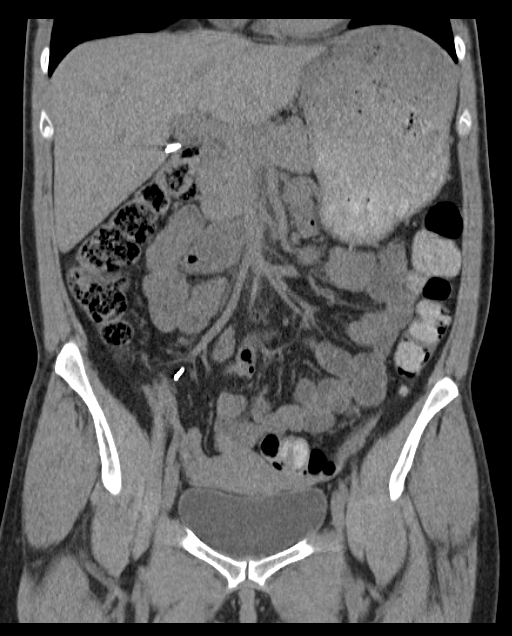
[im 36/80  bone]
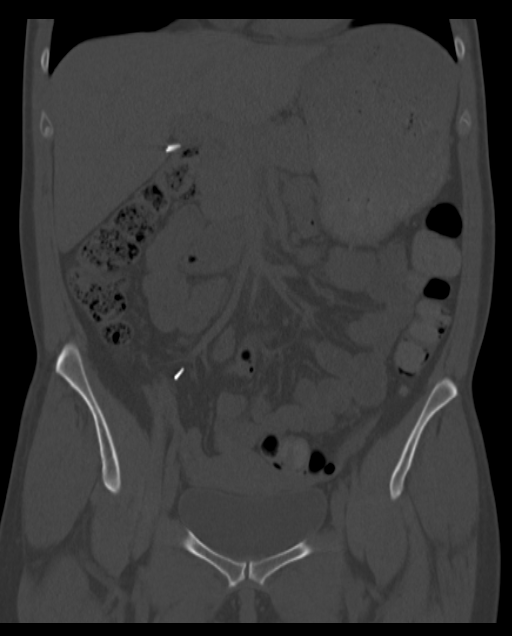
[im 44/80  soft-tissue]
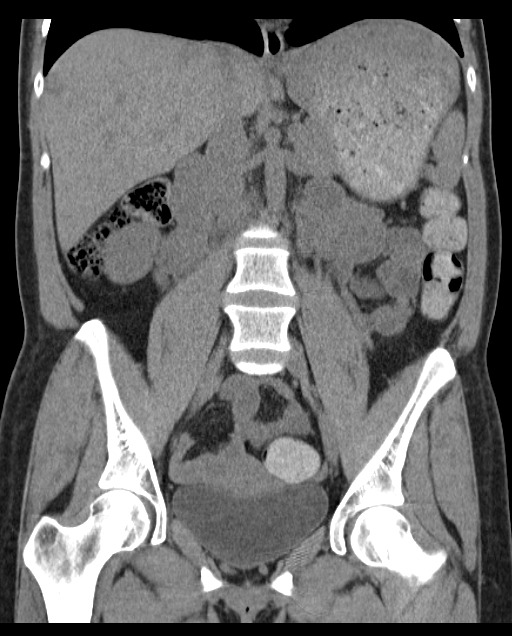

[Series 5: mpr sagittal (id) · sagittal · 0.49mm/px · 1 of 106 slices shown, 2 images]
[im 36/106  soft-tissue]
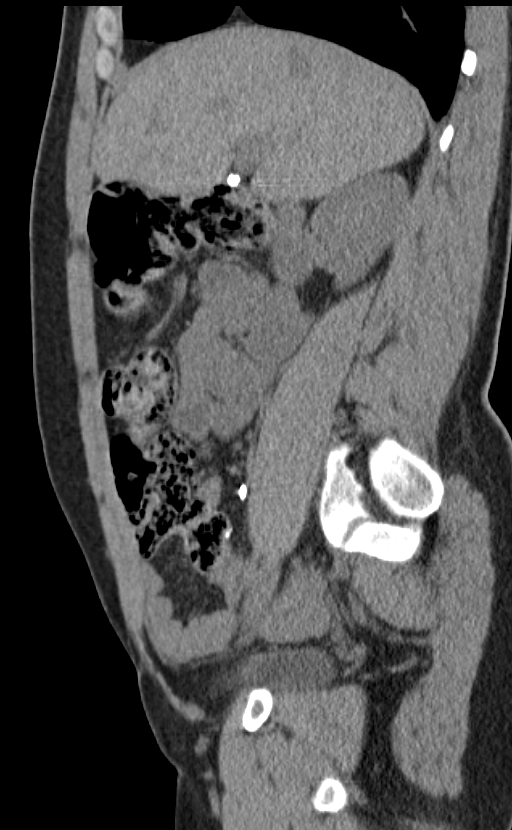
[im 36/106  bone]
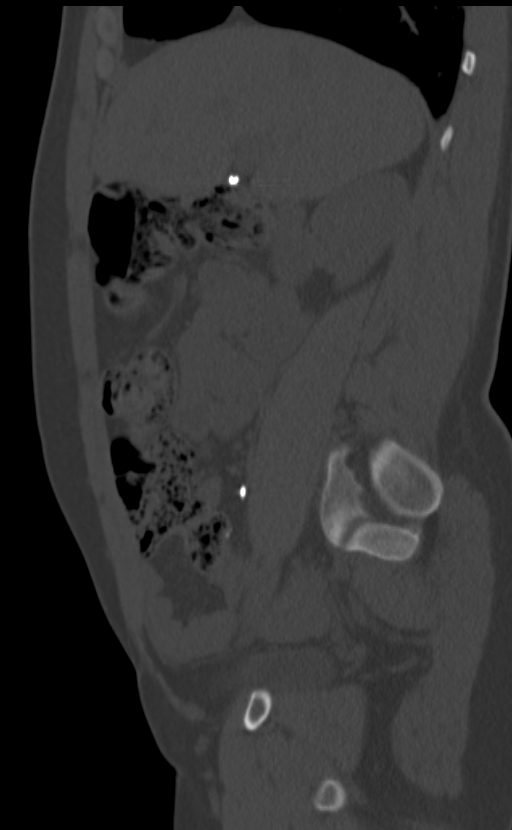

[7 of 46 positions shown; findings below may reference images not displayed]

FINDINGS: Lower chest: Lung bases are clear.

Hepatobiliary: No focal hepatic lesion. Postcholecystectomy. No
biliary dilatation.

Pancreas: Pancreas is normal. No ductal dilatation. No pancreatic
inflammation.

Spleen: Normal spleen

Adrenals/urinary tract: Adrenal glands and kidneys are normal. The
ureters and bladder normal.

Stomach/Bowel: Stomach, small bowel, and cecum are normal. The colon
and rectosigmoid colon are normal.

Vascular/Lymphatic: Abdominal aorta is normal caliber with
atherosclerotic calcification. There is no retroperitoneal or
periportal lymphadenopathy. No pelvic lymphadenopathy.

Reproductive: Uterus and ovaries are normal.

Other: No free fluid.

Musculoskeletal: No aggressive osseous lesion.
IMPRESSION: 1. No nephrolithiasis, ureterolithiasis or obstructive uropathy.
2. Post appendectomy and cholecystectomy
3. No acute abdominal findings. No change from CT several hours
earlier.
Findings conveyed Shanaya Ribas on 10/11/2015  at[DATE].
